# Patient Record
Sex: Female | Born: 1950 | Race: White | Hispanic: No | Marital: Married
Health system: Midwestern US, Community
[De-identification: ages and names within clinical notes are randomized; demographics above are authoritative.]

## PROBLEM LIST (undated history)

## (undated) DIAGNOSIS — M810 Age-related osteoporosis without current pathological fracture: Secondary | ICD-10-CM

## (undated) DIAGNOSIS — R011 Cardiac murmur, unspecified: Secondary | ICD-10-CM

## (undated) DIAGNOSIS — J189 Pneumonia, unspecified organism: Secondary | ICD-10-CM

## (undated) DIAGNOSIS — N159 Renal tubulo-interstitial disease, unspecified: Secondary | ICD-10-CM

## (undated) DIAGNOSIS — H532 Diplopia: Secondary | ICD-10-CM

## (undated) DIAGNOSIS — J45909 Unspecified asthma, uncomplicated: Secondary | ICD-10-CM

## (undated) DIAGNOSIS — E7800 Pure hypercholesterolemia, unspecified: Secondary | ICD-10-CM

## (undated) DIAGNOSIS — M1990 Unspecified osteoarthritis, unspecified site: Secondary | ICD-10-CM

## (undated) DIAGNOSIS — K219 Gastro-esophageal reflux disease without esophagitis: Secondary | ICD-10-CM

## (undated) DIAGNOSIS — I10 Essential (primary) hypertension: Secondary | ICD-10-CM

## (undated) DIAGNOSIS — J40 Bronchitis, not specified as acute or chronic: Secondary | ICD-10-CM

## (undated) DIAGNOSIS — M8580 Other specified disorders of bone density and structure, unspecified site: Secondary | ICD-10-CM

## (undated) HISTORY — DX: Diplopia: H53.2

## (undated) HISTORY — PX: TUBAL LIGATION: SHX77

## (undated) HISTORY — DX: Pure hypercholesterolemia, unspecified: E78.00

## (undated) HISTORY — PX: TONSILLECTOMY: SUR1361

---

## 1983-12-04 HISTORY — PX: TUBAL LIGATION: SHX77

## 1998-12-03 HISTORY — PX: NISSEN FUNDOPLICATION: SHX2091

## 2010-12-20 ENCOUNTER — Telehealth

## 2010-12-20 NOTE — Telephone Encounter (Signed)
Pt needs dexa order sent to [phone]

## 2011-01-01 NOTE — Telephone Encounter (Signed)
Scheduled for 03/2011 needs dexa order faxed to [phone]

## 2011-01-02 NOTE — Telephone Encounter (Signed)
DXA 03/2011

## 2011-01-03 NOTE — Telephone Encounter (Signed)
Dxa order faxed [phone]

## 2011-03-26 MED ORDER — RECLAST 5 MG/100ML IV SOLN
5 MG/100ML | Freq: Once | INTRAVENOUS | Status: DC
Start: 2011-03-26 — End: 2013-05-11

## 2011-03-26 NOTE — Telephone Encounter (Signed)
Patient able to be scanned (DXA).

## 2011-03-26 NOTE — Progress Notes (Signed)
PATIENT NAME: Phyllis Pineda  DATE OF BIRTH: 1951/11/04  INITIAL CONSULTATION: 08/27/2007  TODAY'S VISIT: 03/22/2010    PROBLEMS:   Low bone density by DXA 07/2000, lowest T-score -2. 1 in the spine (L1-L2, L4)    Compression fracture, incidental finding 2007, reviewed film no fracture seen    Fractured rib, age 60 (2008) slipped on ice     Family history of osteoporosis, sister and mother with multiple fractures   Natural menopause age 44 (58)  Hypertension  Environmental allergies  Severe GERD, fundoplication age 81 (43), now asymptomatic    CURRENT MANAGEMENT FOR BONE HEALTH:   Calcium: 600-900 mg/d + 600 mg/d supplements = 1300-1900 mg/d  Vitamin D: 400 IU/d along with calcium + 400 IU/d multivitamin + 5000 IU/d  Exercise: none  Pharmacologic therapy: none       PREVIOUS MEDICATIONS FOR OSTEOPOROSIS:   Fosamax 2001-2008, thought to be less effective than Actonel  Actonel 04/2007-06/2007, stopped due to muscle spasm in the back  Fosamax 70 mg weekly 2009-03/2010, stopped for a ???drug holiday???    OTHER CURRENT MEDICATIONS (SELECTED): None    OTC MEDICATIONS: None    CHIEF COMPLAINT: Here for followup visit of osteoporosis.  No new signs or symptoms    SUBJECTIVE:   She stopped alendronate as directed.  She has added a supplement of vitamin D, 5000 IU daily.  No falls, near-falls or fractures. She feels well overall.    See problem list for chronic/inactive conditions.    FOR FULL DETAILS OF FAMILY HISTORY, PAST MEDICAL AND SURGICAL HISTORY, SOCIAL HISTORY, AND REVIEW OF SYSTEMS, SEE PATIENT QUESTIONNAIRE OF TODAY'S DATE.    FAMILY HISTORY:  No family history of osteoporosis.  Otherwise, not contributory.    PAST MEDICAL HISTORY:  Not contributory except as indicated in the problem list and/or HPI.    SOCIAL HISTORY:  She does not smoke or use alcohol or caffeine to excess.  She lives with her husband.    REVIEW OF SYSTEMS:  Maximum adult height: 63.75 in.  No significant height loss.  Usual weight: 125  lbs.    PHYSICAL EXAMINATION:  NEUROLOGIC EXAM: Able to rise from chair without using arms.  No apparent focal motor or sensory deficit.  Reflexes brisk and symmetric.  Coordination appears normal.  MUSCULOSKELETAL EXAM:   Gait: Intact without difficulty.  Steady without assistance.  Spine: Spinal contours are normal.  No spine tenderness to palpation or percussion.  Ribs and pelvis: Ribs appear normal. Pelvis appears normal. Two finger spaces between ribs and pelvis.    BONE DENSITY:  Most recent done at here 03/26/2011.      T-scores  Initial study: 01/14/2009 spine L1-L4 -2.0 Lowest hip (left femoral neck) -1.0   Current study: 03/26/2011 spine L1-L4 -2.4 Lowest hip (left femoral neck) -1.4     The table below shows bone mineral density (grams/cm2), the appropriate measure for comparing serial scans An ???increase??? or ???decrease??? is significant based on precision studies done at our center according to the ISCD protocol.      PA spine Proximal Femur (left)   Date L1-L4 Femoral neck Trochanter Total hip   01/14/2009 0.830 0.743 0.601 0.841   03/22/2010 0.822 0.753 0.596 0.831   03/26/2011 0.779 0.688 0.592 0.792     IMPRESSION:  BONE DENSITY IS LOW.  BETWEEN 2011 AND 2012, BMD DECREASED IN THE SPINE AND FEMORAL NECK AND IS TRENDING DOWN IN THE TOTAL HIP.     RECORDS REVIEWED: 03/11/2007, 25-OH vitamin  D 35 ng/mL    ASSESSMENT:  Low bone density seemed stable while she was on bisphosphonate therapy 2001-2011 but decreased off treatment 2011-2012    THERAPEUTIC PLANS:  Restart treatment. She is interested in Reclast so will start that process but can go back with alendronate if needed.      We discussed the possible increased risk of myocardial infarction with over-aggressive calcium supplements; she should limit her calcium supplements to no more than 600 mg daily.      I am concerned that 5,000 IU of vitamin D daily is above the ???safe upper limit??? of 4000 IU/d recommended by the [employer] committee so  will check her 25-OH D level.    Return in 1 year with DXA.    CC:  Gennaro Africa MD  Coralee North MD

## 2011-03-27 LAB — CREATININE
Creatinine: 0.7 mg/dl (ref 0.6–1.1)
GFR Est, African/Amer: >60
GFR, Estimated: >60 (ref >60)

## 2011-03-27 LAB — VITAMIN D 25 HYDROXY: Vit D, 25-Hydroxy: 53.1 ng/ml

## 2011-03-27 LAB — CALCIUM: Calcium: 9.7 mg/dl (ref 8.3–10.6)

## 2011-03-29 ENCOUNTER — Encounter

## 2011-04-03 NOTE — Progress Notes (Signed)
Duplicate creatinine order. Results are under lab tab.

## 2011-04-13 MED ORDER — ZOLEDRONIC ACID 5 MG/100ML IV SOLN
5 MG/100ML | Freq: Once | INTRAVENOUS | Status: AC
Start: 2011-04-13 — End: 2011-04-13
  Administered 2011-04-13: 19:00:00 via INTRAVENOUS

## 2011-04-13 MED FILL — RECLAST 5 MG/100ML IV SOLN: 5 MG/100ML | INTRAVENOUS | Qty: 100

## 2011-04-13 NOTE — Progress Notes (Signed)
Reviewed instructions with patient and spouse, aware to continue fluids through the night. Given company information pamplet on Reclast.  Discharged at 1540 ambulatory after voiding.  IV dc'd at 1535.

## 2011-04-13 NOTE — Plan of Care (Signed)
Identify the learner who is being assessed for education: Patient  Ability to Learn:  Exhibits ability to grasp concepts and respond to questions: High  Ready to Learn: Yes  calm  Preferred Method of Learning:  verbal  Barriers to Learning: Verbalizes interest  Special Considerations due to cultural, religious, spiritual beliefs: no  Language: English  Language Interpreter: no    [facility] Educational Information Needs:        Pre Procedure Care   [x] Patient will verbalize understanding of pre-procedure instructions           Pain scale and pain management     [x] Patient will verbalize understanding of pain scale and pain management.     [x] Patient will verbalize plan for pain management    Fall Risk Potential, Pre-procedure  [x] No pre-procedure risk identified  [] Pre-procedure risk identified:  []  Sensory deficit  []  Motor deficit  []  Balance problem  []  Home medication  [] Uses assistive device to ambulate    Goal(s) for fall prevention:  [x]  Prevent fall or injury by use of encouraging call light for assistance, side rails, and assisting with activity.  [x] Patient / Significant other verbalize understanding of need to call for assistance prior to getting out of bed.    Infection Precautions  [x]  Patient understands implementation of infection precautions  [x]  Handwashing, skin prep prior to IV insertion    Patient Safety  [x]  Patient identification  [x]  General Safety precautions  [x]  Side rails up, bed/stretcher low position and wheels locked  [x] Call light within reach  [x]  Patient instructed to call for assistance prior to getting out of bed    Discharge instructions  [x]  Patient / family voices understanding of home care and follow up procedures.  []  Special Needs:  []  Cooling device  []  Crutches  []  Walker  []  Wound Support device   [] Drain  [] Other                    04/13/2011 3:56 PM Assunta Found

## 2011-04-13 NOTE — Discharge Instructions (Signed)
.        RECLAST PATIENT DISCHARGE INSTRUCTIONS     U.S. Brand Names Reclast; Zometa       The benefits of Reclast last for a year.   Most common side effects include flu-like symptoms, fever, muscle or joint pain, headache, nausea, vomiting, and diarrhea. Most occur within one to three days of treatment.          [facility] AMBULATORY PROCEDURE DISCHARGE INSTRUCTIONS    Please follow the instructions checked below:    DIET INSTRUCTIONS:  [x] Other : Drink plenty of noncaffeine-containing liquid unless otherwise directed  by           doctor.  Marland Kitchen             MEDICATION INSTRUCTIONS:  [x] Resume medications as listed:   [] Prescriptions sent with you.  Use as directed.  When taking pain medications, you may experience dizziness or drowsiness.  Do not drink alcohol or drive when taking these medications.  [x] You may take a non-prescription "headache remedy", if flu like symptoms  [x] Give the list of your medications to your primary care physician on your next visit. Keep your med list updated and carry it with in case of emergencies.     [x]  Take calcium and vitamin D daily, as recommended by your doctor.    FOLLOW-UP CARE:  [x] Call the office for follow-up appointment.  Watch for these significant complications.  Call physician if they or any other problems occur:    Fever over 101    Unrelieved nausea      Unrelieved pain    Flu like symptoms       Physician  DR WATTS Phone number: [phone]    The above instructions were reviewed with patient/significant other.  The following additional patient specific information was reviewed with the patient/significant other:  [] Procedure/physician specific instructions  [] Medication information sheet(s)   [] Catheter associated blood stream infections   [] Other-    I have read and understand the instructions given to me:     ____________________________________________   (Patient/S.O. Signature)           Nurse Assunta Found ,R.N.   Date/time 04/13/2011 3:25 PM          SAME DAY SERVICES:  [phone] (Monday- Friday 6 am - 5 pm)        If you smoke STOP. We care about your health!

## 2011-04-13 NOTE — Plan of Care (Signed)
Identify the learner who is being assessed for education: Patient  Ability to Learn:  Exhibits ability to grasp concepts and respond to questions: High  Ready to Learn: Yes  calm  Preferred Method of Learning:  verbal  Barriers to Learning: Verbalizes interest  Special Considerations due to cultural, religious, spiritual beliefs: no  Language: English  Language Interpreter: no    [facility] Educational Information Needs:        Pre Procedure Care   [x] Patient will verbalize understanding of pre-procedure instructions           Pain scale and pain management     [x] Patient will verbalize understanding of pain scale and pain management.     [x] Patient will verbalize plan for pain management    Fall Risk Potential, Pre-procedure  [x] No pre-procedure risk identified  [] Pre-procedure risk identified:  []  Sensory deficit  []  Motor deficit  []  Balance problem  []  Home medication  [] Uses assistive device to ambulate    Goal(s) for fall prevention:  [x]  Prevent fall or injury by use of encouraging call light for assistance, side rails, and assisting with activity.  [x] Patient / Significant other verbalize understanding of need to call for assistance prior to getting out of bed.    Infection Precautions  [x]  Patient understands implementation of infection precautions  [x]  Handwashing, skin prep prior to IV insertion    Patient Safety  [x]  Patient identification  [x]  General Safety precautions  [x]  Side rails up, bed/stretcher low position and wheels locked  [x] Call light within reach  [x]  Patient instructed to call for assistance prior to getting out of bed    Discharge instructions  [x]  Patient / family voices understanding of home care and follow up procedures.  []  Special Needs:  []  Cooling device  []  Crutches  []  Walker  []  Wound Support device   [] Drain  [] Other                    04/13/2011 4:13 PM Roque Cash

## 2011-05-21 NOTE — Telephone Encounter (Signed)
Patient is aware. Thank you.

## 2011-05-21 NOTE — Telephone Encounter (Signed)
For 25-hydroxyvitamin D, anything between 30 to 60 is good, so not need to change from her current dose.  Thanks.

## 2011-05-21 NOTE — Telephone Encounter (Signed)
Vitamin d level 53.1; patient recalled MD stating that her daily dosage may be changed from her lab results. Would you like to change her vitamin D from 5000 IC/daily to something else?

## 2012-04-17 ENCOUNTER — Encounter

## 2012-04-21 MED ORDER — RECLAST 5 MG/100ML IV SOLN
5 MG/100ML | Freq: Once | INTRAVENOUS | Status: DC
Start: 2012-04-21 — End: 2013-05-11

## 2012-04-21 NOTE — Progress Notes (Signed)
[facility]  [address]  [place] [address]  Phone [phone]   Fax [phone]    NAME: Phyllis Pineda, Phyllis Pineda   DOB: [birth year]   STUDY DATE: 04/21/2012     REFERRING PROVIDER: Lynnell Dike MD    OTHERS WHO NEED REPORTS: Drs. Shelly Stanforth, Avis Waldo Laine and [employer]    INDICATION(S) FOR PERFORMING THE STUDY:  osteopenia (733.90)    CLINICAL INFORMATION PROVIDED BY THE PATIENT: 61 year old woman.  She started natural menopause at age 48. She has not taken estrogen. No history of fragility fractures.  No long-term corticosteroid use. She took Fosamax 2001-03/2010 (stopped for a ???drug holiday???)  She is currently being treated with Reclast (started 04/2011).  Family history of osteoporosis (mother, 2 sisters).    EQUIPMENT: Hologic Discovery       POSITIONING: Good   REGIONS OF INTEREST: Correct   ARTIFACTS: None   STUDY VALID? Yes    T-scores  Initial study: 01/14/2009 spine L1-L4 -2.0 Lowest hip (left fem. neck) -1.0   Current study: 04/21/2012 spine L1-L4 -2.1 Lowest hip (left fem. neck) -1.5     The table below shows bone mineral density (grams/cm2), the appropriate measure for comparing serial scans An ???increase??? or ???decrease??? is significant based on precision studies done at our center according to the ISCD protocol.      PA spine Proximal Femur (left)   Date L1-L4 fem. neck Trochanter Total hip   01/14/2009 0.830 0.743 0.601 0.841   03/22/2010 0.822 0.753 0.596 0.831   03/26/2011 0.779 0.688 0.592 0.792   04/21/2012 0.818 0.685 0.614 0.811     IMPRESSION:  BONE DENSITY IS LOW.  BETWEEN 2012 AND 2013, BMD INCREASED IN THE SPINE AND IS TRENDING UP IN THE TROCHANTER AND TOTAL HIP.     Consider repeating this study in 1-2 years to assess the patient's progress.    _________________________________________________   Scherrie Bateman Jahan Friedlander MD, Director, [facility]

## 2012-04-21 NOTE — Progress Notes (Signed)
PATIENT NAME: Phyllis Pineda  DATE OF BIRTH: 20-Jul-1951  INITIAL CONSULTATION: 08/27/2007  MOST RECENT VISIT: 03/26/2011  TODAY'S DATE: 04/21/2012    PROBLEMS:   Low bone density by DXA 07/2000, lowest T-score -2. 1 in the spine (L1-L2, L4)    Compression fracture, incidental finding 2007, reviewed film no fracture seen    Fractured rib, age 61 (2008) slipped on ice     Family history of osteoporosis, sister and mother with multiple fractures   Natural menopause age 69 (73)  Hypertension  Environmental allergies  Severe GERD, fundoplication age 61 (11)    CURRENT MANAGEMENT FOR BONE HEALTH:   Calcium: 600-900 mg/d + 600 mg/d supplements = 1300-1900 mg/d  Vitamin D: 400 IU/d along with calcium + 400 IU/d multivitamin = 800 IU/d    25-OH D 53 ng/mL 03/2011  Exercise: none  Pharmacologic therapy: Reclast started 04/2011     PREVIOUS MEDICATIONS FOR OSTEOPOROSIS:   Fosamax 2001-2008, thought to be less effective than Actonel  Actonel 04/2007-06/2007, stopped due to muscle spasm in the back  Fosamax 70 mg weekly 2009-03/2010, stopped for a ???drug holiday???    OTHER CURRENT MEDICATIONS (SELECTED): None    OTC MEDICATIONS: None    CHIEF COMPLAINT: Here for followup visit of osteoporosis.  No new signs or symptoms    SUBJECTIVE:   She received Reclast without side effects.  No falls, near-falls or fractures. She feels well overall. She has had some intermittent heartburn - nothing severe - and is working with Dr. Lady Gary on this.    See problem list for chronic/inactive conditions.    FOR FULL DETAILS OF FAMILY HISTORY, PAST MEDICAL AND SURGICAL HISTORY, SOCIAL HISTORY, AND REVIEW OF SYSTEMS, SEE PATIENT QUESTIONNAIRE OF TODAY'S DATE.    FAMILY HISTORY:  No family history of osteoporosis.  Otherwise, not contributory.    PAST MEDICAL HISTORY:  Not contributory except as indicated in the problem list and/or HPI.    SOCIAL HISTORY:  She does not smoke or use alcohol or caffeine to excess.  She lives with her  husband.    REVIEW OF SYSTEMS:  Maximum adult height: 63.75 in.  No significant height loss.  Usual weight: 125 lbs.    PHYSICAL EXAMINATION:  NEUROLOGIC EXAM: Able to rise from chair without using arms.  No apparent focal motor or sensory deficit.  Reflexes brisk and symmetric.  Coordination appears normal.  MUSCULOSKELETAL EXAM:   Gait: Intact without difficulty.  Steady without assistance.  Spine: Spinal contours are normal.  No spine tenderness to palpation or percussion.  Ribs and pelvis: Ribs appear normal. Pelvis appears normal. Two finger spaces between ribs and pelvis.    BONE DENSITY:  Most recent done at here 04/21/2012.      T-scores  Initial study: 01/14/2009 spine L1-L4 -2.0 Lowest hip (left fem. neck) -1.0   Current study: 04/21/2012 spine L1-L4 -2.1 Lowest hip (left fem. neck) -1.5     The table below shows bone mineral density (grams/cm2), the appropriate measure for comparing serial scans An ???increase??? or ???decrease??? is significant based on precision studies done at our center according to the ISCD protocol.      PA spine Proximal Femur (left)   Date L1-L4 fem. neck Trochanter Total hip   01/14/2009 0.830 0.743 0.601 0.841   03/22/2010 0.822 0.753 0.596 0.831   03/26/2011 0.779 0.688 0.592 0.792   04/21/2012 0.818 0.685 0.614 0.811     IMPRESSION:  BONE DENSITY IS LOW.  BETWEEN 2012 AND 2013,  BMD INCREASED IN THE SPINE AND IS TRENDING UP IN THE TROCHANTER AND TOTAL HIP.     ASSESSMENT:  Low bone density seemed stable while she was on bisphosphonate therapy 2001-2011 but decreased off treatment 2011-2012.  She is doing fine with Reclast started 04/2011.    PLANS:  Continue Reclast yearly, labs before (calcium, creatinine).  Return in 1 year with DXA.    CC:  Gennaro Africa MD  Babs Bertin MD  Helane Gunther MD

## 2012-04-22 NOTE — Telephone Encounter (Signed)
Patient received her flu shot in 09/11/2011

## 2012-04-22 NOTE — Telephone Encounter (Signed)
Left message for the physician Mariana Single(Shelly Stanforth) office related to lab results.

## 2012-04-22 NOTE — Telephone Encounter (Signed)
Message copied by Ross LudwigSWEET, CORAY E. on Tue Apr 22, 2012  8:59 AM  ------       Message from: WestlakeMYCHART, PROCESSOR       Created: Mon Apr 21, 2012  3:50 PM       Regarding: Non-Urgent Medical Question       Contact: [phone]         Dr. Grace IsaacWatts, For your records, Flu Shot recevied September 11, 2011 Regards, Nelda MarseillePat Schreier  ------

## 2012-05-01 NOTE — Telephone Encounter (Signed)
Spoke with BentleyJasmine at [employer], no prior authorization is required due to patient has an [employer] plan. Ref. #[id number].

## 2012-05-01 NOTE — Telephone Encounter (Signed)
Left message for the patient to call the office related to the reclast benefit summary.

## 2012-05-02 NOTE — Telephone Encounter (Signed)
Read verbatim reclast benefit summary. Patient acknowledged. I let the patient know that the [facility] and they will call her to set up an appointment

## 2012-05-19 MED ORDER — ZOLEDRONIC ACID 5 MG/100ML IV SOLN
5 MG/100ML | Freq: Once | INTRAVENOUS | Status: AC
Start: 2012-05-19 — End: 2012-05-19
  Administered 2012-05-19: 19:00:00 via INTRAVENOUS

## 2012-05-19 MED FILL — ZOLEDRONIC ACID 5 MG/100ML IV SOLN: 5 MG/100ML | INTRAVENOUS | Qty: 100

## 2012-05-19 NOTE — Discharge Instructions (Signed)
.        RECLAST PATIENT DISCHARGE INSTRUCTIONS     U.S. Brand Names Reclast; Zometa       The benefits of Reclast last for a year.   Most common side effects include flu-like symptoms, fever, muscle or joint pain, headache, nausea, vomiting, and diarrhea. Most occur within one to three days of treatment.          [facility] AMBULATORY PROCEDURE DISCHARGE INSTRUCTIONS    Please follow the instructions checked below:    DIET INSTRUCTIONS:  [x] Other : Drink plenty of noncaffeine-containing liquid unless otherwise directed by      doctor.  Marland Kitchen             MEDICATION INSTRUCTIONS:  [x] Resume medications as listed:   [] Prescriptions sent with you.  Use as directed.  When taking pain medications, you may experience dizziness or drowsiness.  Do not drink alcohol or drive when taking these medications.  [x] You may take a non-prescription "headache remedy", if flu like symptoms  [x] Give the list of your medications to your primary care physician on your next visit. Keep your med list updated and carry it with in case of emergencies.     [x]  Take calcium and vitamin D daily, as recommended by your doctor.    FOLLOW-UP CARE:  [x] Call the office for follow-up appointment.  Watch for these significant complications.  Call physician if they or any other problems occur:    Fever over 101    Unrelieved nausea      Unrelieved pain    Flu like symptoms       Physician DR WATTS Phone number: [phone]      The above instructions were reviewed with patient/significant other.  The following additional patient specific information was reviewed with the patient/significant other:  [] Procedure/physician specific instructions  [] Medication information sheet(s)   [] Catheter associated blood stream infections   [] Other-    I have read and understand the instructions given to me:     ____________________________________________   (Patient/S.O. Signature)           Date/time 05/19/2012 3:07 PM         SAME DAY SERVICES:  [phone]  (Monday- Friday 6 am - 5 pm)        If you smoke STOP. We care about your health!

## 2012-05-19 NOTE — Progress Notes (Addendum)
Procedure:  Reclast Infusion  May 19, 2012  Elaina Pattee    Allergies:    Allergies   Allergen Reactions   . Ciprofloxacin    . Diovan (Valsartan)    . Lotrel    . Tetracyclines        Filed Vitals:    05/19/12 1522   BP: 131/75   Pulse: 71   Temp:    Resp: 18       Creatinine Clearance: 96.2mg /dl      Mental Status:  Normal  Readiness to learn:  Yes  Barriers to learning: No    Pain Assessment   Pain Present:  no  Pain Score:  0  Pain Quality/Description:      Mobility/Safety/Self-Care:  Steady Gait:  Yes  Assistive Device:  None   Poor Hygiene:  No    Procedure Verification with:  [x]  RN  []  Patient  []  Other:     Note Reviewed DC reclast instructions.  Very cooperative with taking fluids and aware to continue fluids. No reaction to meds and released ambulatory. Pressure to IV site no bleeding  Reclast  Infusion begun 1442  Infusion ended 1514    Plan of Care Goals:  Safety measures met:  Yes  Patient understands explanation of procedure:  Yes  Patient understands post procedure instructions and to resume regularly scheduled home meds as directed:  Yes  1540 Late entry Note to dr Grace Isaac was faxed to inform him she received her reclast today no problems.  Time in:  1420  Time out:  1520

## 2013-05-08 ENCOUNTER — Encounter

## 2013-05-11 MED ORDER — RECLAST 5 MG/100ML IV SOLN
5 MG/100ML | Freq: Once | INTRAVENOUS | Status: AC
Start: 2013-05-11 — End: 2013-05-11

## 2013-05-11 NOTE — Progress Notes (Signed)
PATIENT NAME: Jenille Odriscoll  DATE OF BIRTH: 04-07-1951  INITIAL CONSULTATION: 08/27/2007  MOST RECENT VISIT: 04/21/2012  TODAY'S DATE: 05/11/2013    PROBLEMS:   Low bone density by DXA 07/2000, lowest T-score -2. 1 in the spine (L1-L2, L4)    Compression fracture, incidental finding 2007, reviewed film no fracture seen    Fractured rib, age 62 (2008) slipped on ice     Family history of osteoporosis, sister and mother with multiple fractures   Natural menopause age 41 (68)  Hypertension  Environmental allergies  Severe GERD, fundoplication age 57 (26), now asymptomatic    CURRENT MANAGEMENT FOR BONE HEALTH:   Calcium: 600-900 mg/d + 500 mg/d supplements = 1100-1400 mg/d  Vitamin D: 400 IU with calcium + 500 IU multivitamin+ 5000 IU= 5900 IU/d    25-OH D 53 ng/mL 03/2011  Exercise: walking, gardening  Pharmacologic therapy: Reclast started 04/2011     PREVIOUS MEDICATIONS FOR OSTEOPOROSIS:   Fosamax 2001-2008, thought to be less effective than Actonel  Actonel 04/2007-06/2007, stopped due to muscle spasm in the back  Fosamax 70 mg weekly 2009-03/2010, stopped for a ???drug holiday???    OTHER CURRENT MEDICATIONS (SELECTED): None    OTC MEDICATIONS: None    CHIEF COMPLAINT: Here for followup visit of osteoporosis.  No new signs or symptoms    SUBJECTIVE:   She received Reclast without side effects.  No falls, near-falls or fractures. She feels well overall.     See problem list for chronic/inactive conditions.    FOR FULL DETAILS OF FAMILY HISTORY, PAST MEDICAL AND SURGICAL HISTORY, SOCIAL HISTORY, AND REVIEW OF SYSTEMS, SEE PATIENT QUESTIONNAIRE OF TODAY'S DATE.    PHYSICAL EXAMINATION:  NEUROLOGIC EXAM: Able to rise from chair without using arms.  No apparent focal motor or sensory deficit.  Reflexes brisk and symmetric.  Coordination appears normal.  MUSCULOSKELETAL EXAM:   Gait: Intact without difficulty.  Steady without assistance.  Spine: Spinal contours are normal.  No spine tenderness to palpation or  percussion.  Ribs and pelvis: Ribs appear normal. Pelvis appears normal. Two finger spaces between ribs and pelvis.      BONE DENSITY:  Most recent done at here 05/11/2013.    T-scores  Initial study: 01/14/2009 spine L1-L4 -2.0 Lowest hip (left fem. neck) -1.0   Current study: 05/11/2013 spine L1-L2-L4 -2.4 Lowest hip (left fem. neck) -1.6     The table below shows bone mineral density (grams/cm2), the appropriate measure for comparing serial scans An ???increase??? or ???decrease??? is significant based on precision studies done at our center according to the ISCD protocol.      PA spine Proximal Femur (left)   Date L1-L2-L4 fem. neck Trochanter Total hip   01/14/2009 NA 0.743 0.601 0.841   03/22/2010 NA 0.753 0.596 0.831   03/26/2011 0.768 0.688 0.592 0.792   04/21/2012 0.800 0.685 0.614 0.811   05/11/2013 0.768 0.666 0.587 0.791     IMPRESSION:  BONE DENSITY IS LOW.  BETWEEN 2013 AND 2014, BMD DID NOT CHANGE SIGNIFICANTLY IN THE SPINE OR LEFT HIP.     Labs: 03/26/2011, Ca 9.7, Cr 0.7  04/06/2013, Ca 9.7, Cr 0.66  Reviewed DXA printouts    ASSESSMENT:  Low bone density seemed stable while she was on bisphosphonate therapy 2001-2011 but decreased off treatment 2011-2012.  She is doing fine with Reclast started 04/2011.  Current vitamin D intake may be too high.    PLANS:  Check 25-OH D. Continue Reclast for one more dose.  Return in 1 year with DXA. Consider a ???drug holiday??? at that time.    Scherrie Bateman Eli Pattillo MD, Director, [facility]    CC:  Gennaro Africa MD  Babs Bertin MD  Helane Gunther MD  Copy also for the patient

## 2013-05-12 LAB — VITAMIN D 25 HYDROXY: Vit D, 25-Hydroxy: 67 ng/mL (ref >29)

## 2013-05-12 NOTE — Progress Notes (Signed)
[facility]  [address]  [place] [address]  Phone [phone]   Fax [phone]    NAME: Phyllis Pineda, Phyllis Pineda   DOB: 1951-12-02   STUDY DATE: 05/11/2013     REFERRING PROVIDER: Lynnell Dike MD    OTHERS WHO NEED REPORTS: Drs. Shelly Stanforth, Avis Waldo Laine and [employer]    INDICATION(S) FOR PERFORMING THE STUDY:  osteopenia (733.90)    CLINICAL INFORMATION PROVIDED BY THE PATIENT: 62 year old woman.  She started natural menopause at age 72. She has not taken estrogen. No history of fragility fractures.  No long-term corticosteroid use. She took Fosamax 2001-03/2010 (stopped for a ???drug holiday???)  She is currently being treated with Reclast (started 04/2011).  Family history of osteoporosis (mother, 2 sisters).    EQUIPMENT: H[employer]   POSITIONING: Good   REGIONS OF INTEREST: Correct   ARTIFACTS: None   STUDY VALID? Yes; L3 was deleted because of T-score discrepancy starting 2014.    T-scores  Initial study: 01/14/2009 spine L1-L4 -2.0 Lowest hip (left fem. neck) -1.0   Current study: 05/11/2013 spine L1-L2-L4 -2.4 Lowest hip (left fem. neck) -1.6     The table below shows bone mineral density (grams/cm2), the appropriate measure for comparing serial scans An ???increase??? or ???decrease??? is significant based on precision studies done at our center according to the ISCD protocol.      PA spine Proximal Femur (left)   Date L1-L2-L4 fem. neck Trochanter Total hip   01/14/2009 NA 0.743 0.601 0.841   03/22/2010 NA 0.753 0.596 0.831   03/26/2011 0.768 0.688 0.592 0.792   04/21/2012 0.800 0.685 0.614 0.811   05/11/2013 0.768 0.666 0.587 0.791     IMPRESSION:  BONE DENSITY IS LOW.  BETWEEN 2013 AND 2014, BMD DID NOT CHANGE SIGNIFICANTLY IN THE SPINE OR LEFT HIP.     Consider repeating this study in 1-2 years to assess the patient's progress.    _________________________________________________   Scherrie Bateman Maevis Mumby MD, Director, [facility]

## 2013-05-15 LAB — CREATINE
Creatinine: 0.66 mg/dL (ref 0.44–1.03)
GFR African American: >60 mL/min/{1.73_m2} (ref >60)
GFR Non-African American: >60 mL/min/{1.73_m2} (ref >60)

## 2013-05-15 LAB — CALCIUM: Calcium: 9.6 mg/dL (ref 8.5–10.5)

## 2013-06-08 MED ORDER — ZOLEDRONIC ACID 5 MG/100ML IV SOLN
5 MG/100ML | Freq: Once | INTRAVENOUS | Status: AC
Start: 2013-06-08 — End: 2013-06-08
  Administered 2013-06-08: 20:00:00 via INTRAVENOUS

## 2013-06-08 MED FILL — ZOLEDRONIC ACID 5 MG/100ML IV SOLN: 5 MG/100ML | INTRAVENOUS | Qty: 100

## 2013-06-08 NOTE — Progress Notes (Signed)
Pt tolerated reclast infusion well, drank 2 apple juices during infusion. No probvlems noted, IV d/c'd, pt up to void then D/C'd to home.

## 2013-06-08 NOTE — Discharge Instructions (Signed)
.        RECLAST PATIENT DISCHARGE INSTRUCTIONS     U.S. Brand Names Reclast; Zometa       The benefits of Reclast last for a year.   Most common side effects include flu-like symptoms, fever, muscle or joint pain, headache, nausea, vomiting, and diarrhea. Most occur within one to three days of treatment.          [facility] AMBULATORY PROCEDURE DISCHARGE INSTRUCTIONS    Please follow the instructions checked below:    DIET INSTRUCTIONS:  [x] Other : Drink plenty of noncaffeine-containing liquid unless otherwise directed by      doctor.  Marland Kitchen             MEDICATION INSTRUCTIONS:  [x] Resume medications as listed:   [] Prescriptions sent with you.  Use as directed.  When taking pain medications, you may experience dizziness or drowsiness.  Do not drink alcohol or drive when taking these medications.  [x] You may take a non-prescription "headache remedy", if flu like symptoms  [x] Give the list of your medications to your primary care physician on your next visit. Keep your med list updated and carry it with in case of emergencies.     [x]  Take calcium and vitamin D daily, as recommended by your doctor.    FOLLOW-UP CARE:  [x] Call the office for follow-up appointment when needed.  Watch for these significant complications.  Call physician if they or any other problems occur:    Fever over 101    Unrelieved nausea      Unrelieved pain    Flu like symptoms       Physician Dr Grace Isaac       The above instructions were reviewed with patient/significant other.  The following additional patient specific information was reviewed with the patient/significant other:  [] Procedure/physician specific instructions  [] Medication information sheet(s)   [] Catheter associated blood stream infections   [] Other-    I have read and understand the instructions given to me:     ____________________________________________   (Patient/S.O. Signature)           Date/time 06/08/2013 3:44 PM         SAME DAY SERVICES:  [phone] (Monday-  Friday 6 am - 5 pm)        If you smoke STOP. We care about your health!

## 2014-05-04 ENCOUNTER — Encounter

## 2014-05-24 NOTE — Progress Notes (Signed)
[facility]  [address]  [place] [id number]  Phone [phone]   Fax [phone]    NAME: [name], Phyllis C.   DOB: September 02, 1951   STUDY DATE: 05/24/2014     REFERRING PROVIDER: Lynnell DikeNelson Josefita Weissmann MD     INDICATION(S) FOR PERFORMING THE STUDY:  osteopenia (733.90)    CLINICAL INFORMATION PROVIDED BY THE PATIENT: 63 year old woman.  She started natural menopause at age 63. She has not taken estrogen. No history of fragility fractures.  No long-term corticosteroid use. She took Fosamax 2001-03/2010 (stopped for a ???drug holiday???)  She is currently being treated with Reclast (started 04/2011).  Family history of osteoporosis (mother, 2 sisters).    EQUIPMENT: Hologic Discovery   POSITIONING: Good   REGIONS OF INTEREST: Correct   ARTIFACTS: None   STUDY VALID? Yes; L3 was deleted starting 2014 because of T-score discrepancy.    T-scores  Initial study: 01/14/2009 spine L1-L4 -2.0 Lowest hip (left fem. neck) -1.0   Current study: 05/24/2014 spine L1-L2-L4 -2.3 Lowest hip (left fem. neck) -1.6     The table below shows bone mineral density (grams/cm2), the appropriate measure for comparing serial scans An ???increase??? or ???decrease??? is significant based on precision studies done at our center according to the ISCD protocol.      PA spine Proximal Femur (left)   Date L1-L2-L4 fem. neck Trochanter Total hip   01/14/2009 NA 0.743 0.601 0.841   03/22/2010 NA 0.753 0.596 0.831   03/26/2011 0.768 0.688 0.592 0.792   04/21/2012 0.800 0.685 0.614 0.811   05/11/2013 0.768 0.666 0.587 0.791   05/24/2014 0.785 0.670 0.608 0.794     IMPRESSION:  BONE DENSITY IS LOW.  BETWEEN 2014 AND 2015, BMD DID NOT CHANGE SIGNIFICANTLY IN THE SPINE OR LEFT HIP.     Consider repeating this study in 1-2 years to assess the patient's progress.    _________________________________________________   Scherrie BatemanNelson B. Shaneen Reeser MD, Director, [facility]

## 2014-05-24 NOTE — Progress Notes (Signed)
PATIENT NAME: Phyllis Pineda  DATE OF BIRTH: 1951-06-09  INITIAL CONSULTATION: 08/27/2007  MOST RECENT VISIT: 05/11/2013  TODAY'S DATE: 05/24/2014    PROBLEMS:   Low bone density by DXA 07/2000, lowest T-score -2. 1 in the spine (L1-L2, L4)    Compression fracture, incidental finding 2007, reviewed film no fracture seen    Fractured rib, age 63 (2008) slipped on ice     Family history of osteoporosis, sister and mother with hip and multiple other fractures   Natural menopause age [age](1999)  Hypertension  Environmental allergies  Severe GERD, fundoplication age [age](2000), now asymptomatic    CURRENT MANAGEMENT FOR BONE HEALTH:   Calcium: 600-900 mg/d + 500 mg/d supplements = 1100-1400 mg/d  Vitamin D: 400 IU with calcium + 500 IU multivitamin + 2000 IU= 2900 IU/d    25-OH D 67 ng/mL 05/2013 with vitamin D 5900 IU/d  Exercise: walking, gardening  Pharmacologic therapy: Reclast 04/2011, 2013, 06/2013     PREVIOUS MEDICATIONS FOR OSTEOPOROSIS:   Fosamax 2001-2008, thought to be less effective than Actonel  Actonel 04/2007-06/2007, stopped due to muscle spasm in the back  Fosamax 70 mg weekly 2009-03/2010, stopped for a ???drug holiday???    OTHER CURRENT MEDICATIONS (SELECTED): None  OTC MEDICATIONS: None    CHIEF COMPLAINT: Here for followup visit of osteoporosis.  No new signs or symptoms    SUBJECTIVE:   See problem list for chronic/inactive conditions   She received Reclast without side effects.  No falls, near-falls or fractures. She feels well overall. Since last year her mother had a hip fracture and then died. Ms. Homero FellersFrank has retired and is moving to [place] to be near her grandchildren.  25-OH vitamin D was high last year and she reduced her dose from 5000 IU/d to 2000 IU/d.    FOR FULL DETAILS OF FAMILY HISTORY, PAST MEDICAL AND SURGICAL HISTORY, SOCIAL HISTORY, AND REVIEW OF SYSTEMS, SEE PATIENT QUESTIONNAIRE OF TODAY'S DATE.    PHYSICAL EXAMINATION:  NEUROLOGIC EXAM: Able to rise from chair without using  arms.  No apparent focal motor or sensory deficit.  Reflexes brisk and symmetric.  Coordination appears normal.  MUSCULOSKELETAL EXAM:   Gait: Intact without difficulty.  Steady without assistance.  Spine: Spinal contours are normal.  No spine tenderness to palpation or percussion.  Ribs and pelvis: Ribs appear normal. Pelvis appears normal. Two finger spaces between ribs and pelvis.      BONE DENSITY:  Most recent done at here using Hologic equipment.    T-scores  Initial study: 01/14/2009 spine L1-L4 -2.0 Lowest hip (left fem. neck) -1.0   Current study: 05/24/2014 spine L1-L2-L4 -2.3 Lowest hip (left fem. neck) -1.6     The table below shows bone mineral density (grams/cm2), the appropriate measure for comparing serial scans An ???increase??? or ???decrease??? is significant based on precision studies done at our center according to the ISCD protocol.      PA spine Proximal Femur (left)   Date L1-L2-L4 fem. neck Trochanter Total hip   01/14/2009 NA 0.743 0.601 0.841   03/22/2010 NA 0.753 0.596 0.831   03/26/2011 0.768 0.688 0.592 0.792   04/21/2012 0.800 0.685 0.614 0.811   05/11/2013 0.768 0.666 0.587 0.791   05/24/2014 0.785 0.670 0.608 0.794     IMPRESSION:  BONE DENSITY IS LOW.  BETWEEN 2014 AND 2015, BMD DID NOT CHANGE SIGNIFICANTLY IN THE SPINE OR LEFT HIP.     Labs: 03/26/2011, Ca 9.7, Cr 0.7. 04/06/2013, Ca 9.7,  Cr 0.66.  05/2013, Ca 9.6, Cr 0.66  IMAGING. Reviewed DXA printouts    ASSESSMENT:  Low bone density seemed stable while she was on bisphosphonate therapy 2001-2011 but decreased off treatment 2011-2012.  She is doing fine with Reclast started 04/2011.      PLANS:   Hold Reclast a ???drug holiday??? of at least a year or two. For followup I gave her the names of Drs. Will Truslow in [place] and [place] in [place].    Scherrie BatemanNelson B. Thi Klich MD, Director, [facility]

## 2014-05-24 NOTE — Patient Instructions (Signed)
Dr. Daisy [place]Andrew [place]aster, [facility] [place]    Dr. Vicki MalletWill Truslow in [place], [phone]

## 2014-12-03 HISTORY — PX: BREAST BIOPSY: SHX20

## 2014-12-14 ENCOUNTER — Other Ambulatory Visit: Payer: Self-pay | Admitting: Family Medicine

## 2014-12-14 ENCOUNTER — Ambulatory Visit
Admission: RE | Admit: 2014-12-14 | Discharge: 2014-12-14 | Disposition: A | Discharge status: Home / Self Care | Payer: 59 | Source: Ambulatory Visit | Attending: Family Medicine | Admitting: Family Medicine

## 2014-12-14 DIAGNOSIS — M549 Dorsalgia, unspecified: Secondary | ICD-10-CM

## 2014-12-15 ENCOUNTER — Other Ambulatory Visit: Payer: Self-pay | Admitting: Obstetrics and Gynecology

## 2014-12-15 DIAGNOSIS — N6310 Unspecified lump in the right breast, unspecified quadrant: Secondary | ICD-10-CM

## 2014-12-27 ENCOUNTER — Other Ambulatory Visit: Payer: 59

## 2015-01-03 ENCOUNTER — Ambulatory Visit
Admission: RE | Admit: 2015-01-03 | Discharge: 2015-01-03 | Disposition: A | Discharge status: Home / Self Care | Payer: 59 | Source: Ambulatory Visit | Attending: Obstetrics and Gynecology | Admitting: Obstetrics and Gynecology

## 2015-01-03 DIAGNOSIS — N6310 Unspecified lump in the right breast, unspecified quadrant: Secondary | ICD-10-CM

## 2015-01-21 ENCOUNTER — Other Ambulatory Visit: Payer: Self-pay | Admitting: Obstetrics and Gynecology

## 2015-01-21 DIAGNOSIS — R928 Other abnormal and inconclusive findings on diagnostic imaging of breast: Secondary | ICD-10-CM

## 2015-01-27 ENCOUNTER — Ambulatory Visit
Admission: RE | Admit: 2015-01-27 | Discharge: 2015-01-27 | Disposition: A | Discharge status: Home / Self Care | Payer: 59 | Source: Ambulatory Visit | Attending: Obstetrics and Gynecology | Admitting: Obstetrics and Gynecology

## 2015-01-27 ENCOUNTER — Other Ambulatory Visit: Payer: Self-pay | Admitting: Obstetrics and Gynecology

## 2015-01-27 DIAGNOSIS — R928 Other abnormal and inconclusive findings on diagnostic imaging of breast: Secondary | ICD-10-CM

## 2015-02-02 ENCOUNTER — Other Ambulatory Visit: Payer: Self-pay | Admitting: Obstetrics and Gynecology

## 2015-02-02 DIAGNOSIS — R928 Other abnormal and inconclusive findings on diagnostic imaging of breast: Secondary | ICD-10-CM

## 2015-02-03 ENCOUNTER — Ambulatory Visit
Admission: RE | Admit: 2015-02-03 | Discharge: 2015-02-03 | Disposition: A | Discharge status: Home / Self Care | Payer: 59 | Source: Ambulatory Visit | Attending: Obstetrics and Gynecology | Admitting: Obstetrics and Gynecology

## 2015-02-03 DIAGNOSIS — R928 Other abnormal and inconclusive findings on diagnostic imaging of breast: Secondary | ICD-10-CM

## 2015-04-13 NOTE — Telephone Encounter (Signed)
Patient stated that she needed her medical records sent to another doctor.  She contacted medical records and they said she needed to call our office because they did not have any of her records.  They told her Dr. Grace IsaacWatts keeps his own records.  She wants them sent to:  Dr. Daisy BlossomAndrew Laster  [phone] fax    Please advise.  She can be reached at [phone].

## 2015-04-13 NOTE — Telephone Encounter (Signed)
Fax[facility] last two [facility] [phone]. I let the patient know that office faxed over last two office visit notes. If Dr. Jacquenette ShoneLaster needs to see the Dexa pictures, that can be obtained from where ever the scan was done. Patient acknowledged

## 2015-04-19 ENCOUNTER — Other Ambulatory Visit: Payer: Self-pay

## 2015-04-19 DIAGNOSIS — Z1231 Encounter for screening mammogram for malignant neoplasm of breast: Secondary | ICD-10-CM

## 2015-04-27 ENCOUNTER — Ambulatory Visit
Admission: RE | Admit: 2015-04-27 | Discharge: 2015-04-27 | Disposition: A | Discharge status: Home / Self Care | Payer: 59 | Source: Ambulatory Visit

## 2015-04-27 DIAGNOSIS — Z1231 Encounter for screening mammogram for malignant neoplasm of breast: Secondary | ICD-10-CM

## 2015-07-28 ENCOUNTER — Other Ambulatory Visit (HOSPITAL_COMMUNITY): Payer: Self-pay | Admitting: *Deleted

## 2015-07-29 ENCOUNTER — Encounter (HOSPITAL_COMMUNITY)
Admission: RE | Admit: 2015-07-29 | Discharge: 2015-07-29 | Disposition: A | Discharge status: Home / Self Care | Payer: 59 | Source: Ambulatory Visit | Attending: Family Medicine | Admitting: Family Medicine

## 2015-07-29 DIAGNOSIS — M810 Age-related osteoporosis without current pathological fracture: Secondary | ICD-10-CM | POA: Diagnosis present

## 2015-07-29 MED ORDER — ZOLEDRONIC ACID 5 MG/100ML IV SOLN
INTRAVENOUS | Status: AC
  Administered 2015-07-29: 5 mg via INTRAVENOUS
  Filled 2015-07-29: qty 100

## 2015-07-29 MED ORDER — ZOLEDRONIC ACID 5 MG/100ML IV SOLN
5.0000 mg | Freq: Once | INTRAVENOUS | Status: AC
  Administered 2015-07-29: 5 mg via INTRAVENOUS

## 2015-12-14 ENCOUNTER — Other Ambulatory Visit (HOSPITAL_COMMUNITY)
Admission: RE | Admit: 2015-12-14 | Discharge: 2015-12-14 | Disposition: A | Discharge status: Home / Self Care | Payer: 59 | Source: Ambulatory Visit | Attending: Family Medicine | Admitting: Family Medicine

## 2015-12-14 ENCOUNTER — Other Ambulatory Visit: Payer: Self-pay | Admitting: Family Medicine

## 2015-12-14 DIAGNOSIS — Z01411 Encounter for gynecological examination (general) (routine) with abnormal findings: Secondary | ICD-10-CM | POA: Diagnosis present

## 2015-12-14 DIAGNOSIS — Z1151 Encounter for screening for human papillomavirus (HPV): Secondary | ICD-10-CM | POA: Diagnosis present

## 2015-12-16 LAB — CYTOLOGY - PAP

## 2016-04-27 ENCOUNTER — Other Ambulatory Visit: Payer: Self-pay

## 2016-04-27 DIAGNOSIS — Z1231 Encounter for screening mammogram for malignant neoplasm of breast: Secondary | ICD-10-CM

## 2016-05-14 ENCOUNTER — Ambulatory Visit
Admission: RE | Admit: 2016-05-14 | Discharge: 2016-05-14 | Disposition: A | Discharge status: Home / Self Care | Payer: 59 | Source: Ambulatory Visit

## 2016-05-14 ENCOUNTER — Other Ambulatory Visit: Payer: Self-pay | Admitting: Family Medicine

## 2016-05-14 DIAGNOSIS — Z1231 Encounter for screening mammogram for malignant neoplasm of breast: Secondary | ICD-10-CM

## 2016-07-07 IMAGING — CR DG THORACIC SPINE 3V
3 series · 3 of 3 positions shown · non-contrast
Comparison: None.

CLINICAL DATA: Mid thoracic pain for 1 year.  No known injury.

EXAM:
THORACIC SPINE - 2 VIEW + SWIMMERS

[t t-spine a.p.]
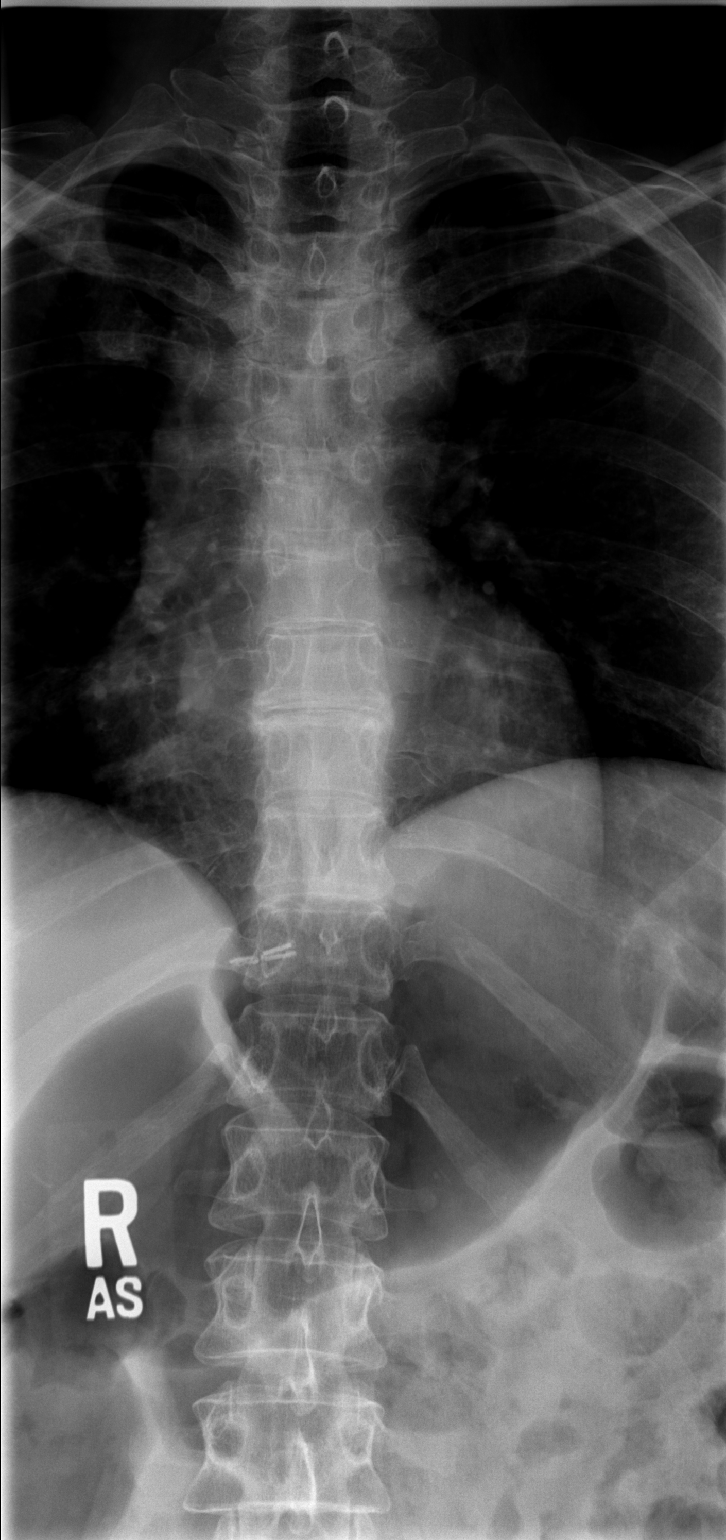

[t t-spine lat *]
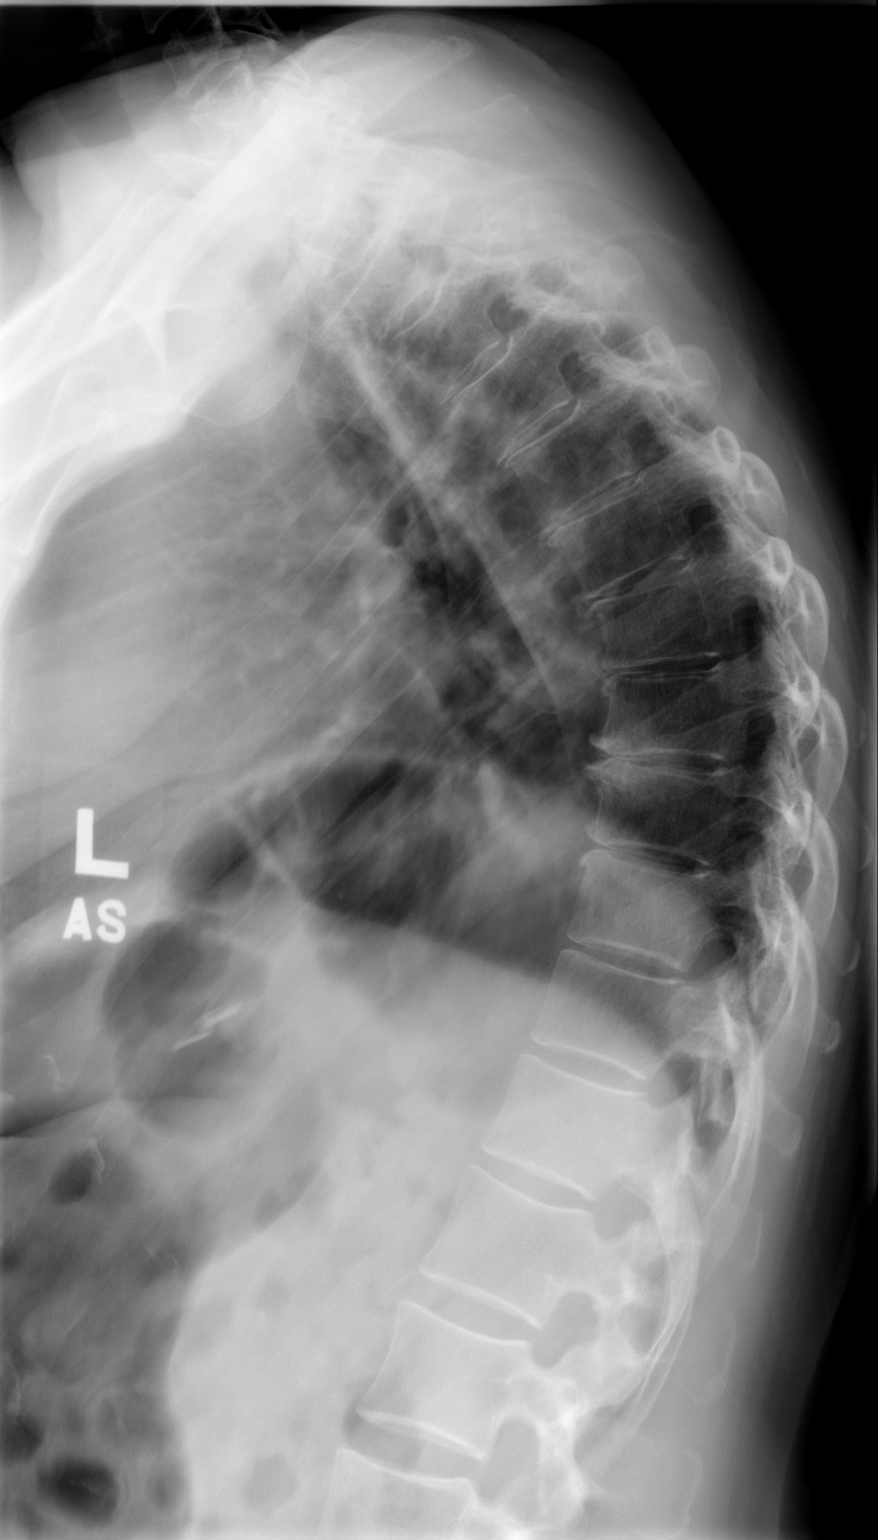

[t swimmers]
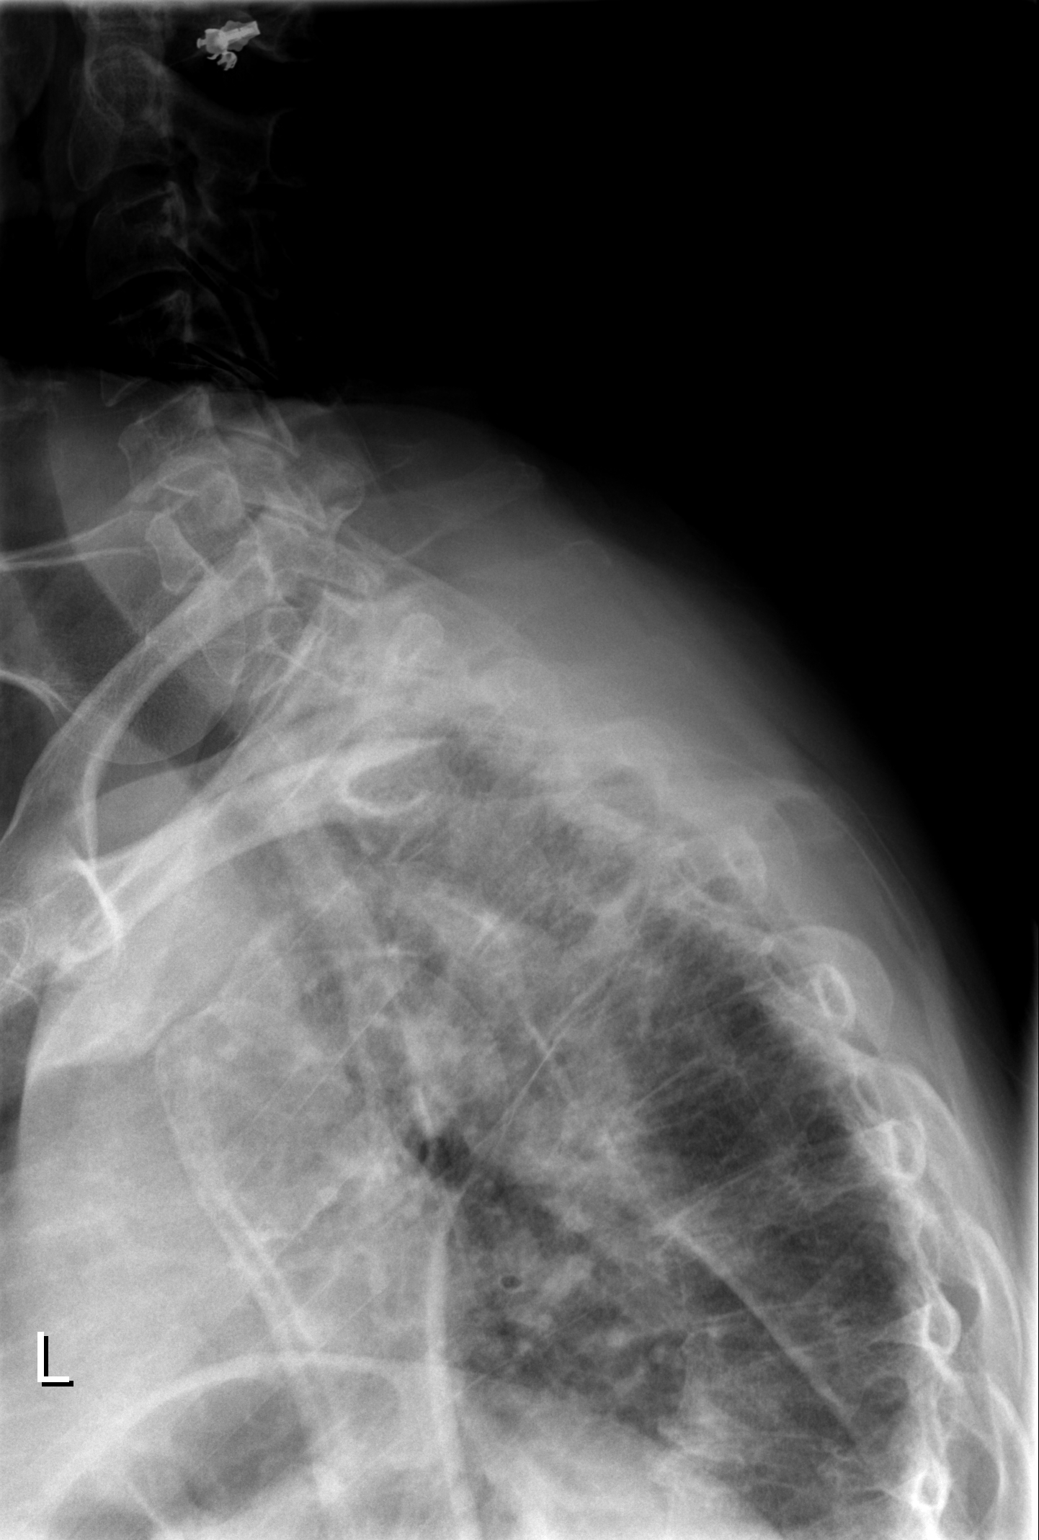

[3 of 3 positions shown; findings below may reference images not displayed]

FINDINGS: No fracture or malalignment is identified. There is exaggeration of
the normal thoracic kyphosis. Multilevel loss of disc space height
and endplate spurring appear worst at T8-9.
IMPRESSION: No acute abnormality.

Exaggeration of thoracic kyphosis.

Degenerative disc disease most notable at T8-9.

## 2017-04-11 ENCOUNTER — Other Ambulatory Visit: Payer: Self-pay | Admitting: Family Medicine

## 2017-04-11 DIAGNOSIS — Z1231 Encounter for screening mammogram for malignant neoplasm of breast: Secondary | ICD-10-CM

## 2017-05-15 ENCOUNTER — Ambulatory Visit
Admission: RE | Admit: 2017-05-15 | Discharge: 2017-05-15 | Disposition: A | Discharge status: Home / Self Care | Payer: 59 | Source: Ambulatory Visit | Attending: Family Medicine | Admitting: Family Medicine

## 2017-05-15 DIAGNOSIS — Z1231 Encounter for screening mammogram for malignant neoplasm of breast: Secondary | ICD-10-CM

## 2018-04-25 ENCOUNTER — Other Ambulatory Visit: Payer: Self-pay | Admitting: Family Medicine

## 2018-04-25 DIAGNOSIS — Z1231 Encounter for screening mammogram for malignant neoplasm of breast: Secondary | ICD-10-CM

## 2018-05-20 ENCOUNTER — Ambulatory Visit: Payer: 59

## 2018-05-22 ENCOUNTER — Ambulatory Visit
Admission: RE | Admit: 2018-05-22 | Discharge: 2018-05-22 | Disposition: A | Discharge status: Home / Self Care | Payer: 59 | Source: Ambulatory Visit | Attending: Family Medicine | Admitting: Family Medicine

## 2018-05-22 DIAGNOSIS — Z1231 Encounter for screening mammogram for malignant neoplasm of breast: Secondary | ICD-10-CM

## 2018-12-05 ENCOUNTER — Encounter: Payer: Self-pay | Admitting: Internal Medicine

## 2018-12-05 ENCOUNTER — Ambulatory Visit (INDEPENDENT_AMBULATORY_CARE_PROVIDER_SITE_OTHER): Payer: Self-pay | Admitting: Internal Medicine

## 2018-12-05 DIAGNOSIS — Z9189 Other specified personal risk factors, not elsewhere classified: Secondary | ICD-10-CM

## 2018-12-05 DIAGNOSIS — Z7185 Encounter for immunization safety counseling: Secondary | ICD-10-CM

## 2018-12-05 DIAGNOSIS — Z7189 Other specified counseling: Secondary | ICD-10-CM

## 2018-12-05 DIAGNOSIS — Z23 Encounter for immunization: Secondary | ICD-10-CM

## 2018-12-05 DIAGNOSIS — Z298 Encounter for other specified prophylactic measures: Secondary | ICD-10-CM

## 2018-12-05 DIAGNOSIS — Z789 Other specified health status: Secondary | ICD-10-CM

## 2018-12-05 DIAGNOSIS — Z7184 Encounter for health counseling related to travel: Secondary | ICD-10-CM

## 2018-12-05 MED ORDER — ATOVAQUONE-PROGUANIL HCL 250-100 MG PO TABS: 1.0000 | ORAL_TABLET | Freq: Every day | ORAL | 0 refills | Status: DC

## 2018-12-05 MED ORDER — AZITHROMYCIN 500 MG PO TABS: 1000.0000 mg | ORAL_TABLET | Freq: Once | ORAL | 0 refills | Status: AC

## 2018-12-05 NOTE — Patient Instructions (Signed)
[facility]                [address], [address]                   [place], [place] [address]                      Phone: [phone]                        Fax: [phone]   Planned departure date: February 2020          Planned return date: 10 days Countries of travel: [place]    Guidelines for the Prevention & Treatment of Traveler's Diarrhea  Prevention: "Boil it, Peel it, Adriana Simas it, or Forget it"   the fewer chances -> lower risk: try to stick to food & water precautions as much as possible"   If it's "piping hot"; it is probably okay, if not, it may not be   Treatment   1) You should always take care to drink lots of fluids in order to avoid dehydration   2) You should bring medications with you in case you come down with a case of diarrhea   3) OTC = bring pepto-bismol - can take with initial abdominal symptoms;                    Imodium - can help slow down your intestinal tract, can help relief cramps                    and diarrhea, can take if no bloody diarrhea  Use azithromycin if needed for traveler's diarrhea  Guidelines for the Prevention of Malaria  Avoidance:  -fewer mosquito bites = lower risk. Mosquitos can bite at night as well as daytime  -cover up (long sleeve clothing), mosquito nets, screens  -Insect repellent for your skin ( DEET containing lotion > 20%): for clothes ( permethrin spray) www.insectshield.com (626) 142-3271) for pretreated permethrin  2 days prior to travel, start malarone, daily dose starting 1-2 days before entering endemic area, ending 7 days after leaving area for malaria prevention.   Immunizations received today: Td and Typhoid (parenteral)  Future immunizations, if indicated none indicated   Prior to travel:  1) Be sure to pick up appropriate prescriptions, including medicine you take daily. Do not expect to be able to fill your prescriptions abroad.  2) Strongly consider  obtaining traveler's insurance, including emergency evacuation insurance. Most plans in the [place] do not cover participants abroad. (see below for resources)  3) Register at the appropriate [place] embassy or consulate with travel dates so they are aware of your presence in-country and for helpful advice during travel using the [employer] (STEP, GuyGalaxy.si).  4) Leave contact information with a relative or friend.  5) Keep a Corporate treasurer, credit cards in case they become lost or stolen  6) Inform your credit card company that you will be travelling abroad   During travel:  1) If you become ill and need medical advice, the [place] [employer] of the country you are traveling in general provides a list of English speaking doctors.  We are also available on MyChart for remote consultation if you register prior to travel. 2) Avoid motorcycles or scooters when at all possible. Traffic laws in many countries are lax and accidents  occur frequently.  3) Do not take any unnecessary risks that you wouldn't do at home.   Resources:  -Country specific information: BlindResource.ca or GreenNylon.com.cy  -Press photographer (DEET, mosquito nets): REI, [employer] store, [employer], Galliano insurance options: gatewayplans.com; http://clayton-rivera.info/; travelguard.com or Good Pilgrim's Pride, gninsurance.com or info@gninsurance .com, [id number].   Post Travel:  If you return from your trip ill, call your primary care doctor or our travel clinic @ [phone].   Enjoy your trip and know that with proper pre-travel preparation, most people have an enjoyable and uninterrupted trip!

## 2018-12-05 NOTE — Progress Notes (Signed)
Patient ID: Kristine Brooks, female   DOB: [birth year], 68 y.o.   MRN: [id number] Subjective:   Kristine Brooks is a 68 y.o. female who presents to the Infectious Disease clinic for travel consultation. Planned departure date: February 2020          Planned return date: 10 days Countries of travel: [place]  Areas in country: urban   Accommodations: unknown Purpose of travel: missionary work Prior travel out of [place]: yes Extensive travel in [place]    Objective:   Medications: reviewed    Assessment:   No contraindications to travel. none     Plan:    Issues discussed: environmental concerns, insect-borne illnesses, Japanese encephalitis, malaria, motion sickness, MVA safety, rabies, safe food/water, traveler's diarrhea, website/handouts for more information, what to do if ill upon return and what to do if ill while there. Immunizations recommended: Td and Typhoid (parenteral). Malaria prophylaxis: malarone, daily dose starting 1-2 days before entering endemic area, ending 7 days after leaving area Traveler's diarrhea prophylaxis: azithromycin. Total duration of visit: 1 Hour. Total time spent on education, counseling, coordination of care: 30 Minutes.

## 2019-04-29 ENCOUNTER — Other Ambulatory Visit: Payer: Self-pay | Admitting: Family Medicine

## 2019-04-29 DIAGNOSIS — Z1231 Encounter for screening mammogram for malignant neoplasm of breast: Secondary | ICD-10-CM

## 2019-06-09 ENCOUNTER — Other Ambulatory Visit: Payer: Self-pay | Admitting: Family Medicine

## 2019-06-09 ENCOUNTER — Other Ambulatory Visit (HOSPITAL_COMMUNITY)
Admission: RE | Admit: 2019-06-09 | Discharge: 2019-06-09 | Disposition: A | Discharge status: Home / Self Care | Payer: Medicare Other | Source: Ambulatory Visit | Attending: Family Medicine | Admitting: Family Medicine

## 2019-06-09 DIAGNOSIS — Z01411 Encounter for gynecological examination (general) (routine) with abnormal findings: Secondary | ICD-10-CM | POA: Diagnosis present

## 2019-06-15 LAB — CYTOLOGY - PAP
Diagnosis: NEGATIVE
HPV: NOT DETECTED

## 2019-06-17 ENCOUNTER — Ambulatory Visit
Admission: RE | Admit: 2019-06-17 | Discharge: 2019-06-17 | Disposition: A | Discharge status: Home / Self Care | Payer: Medicare Other | Source: Ambulatory Visit | Attending: Family Medicine | Admitting: Family Medicine

## 2019-06-17 ENCOUNTER — Other Ambulatory Visit: Payer: Self-pay

## 2019-06-17 DIAGNOSIS — Z1231 Encounter for screening mammogram for malignant neoplasm of breast: Secondary | ICD-10-CM

## 2019-08-28 ENCOUNTER — Ambulatory Visit
Admission: RE | Admit: 2019-08-28 | Discharge: 2019-08-28 | Disposition: A | Discharge status: Home / Self Care | Payer: Medicare Other | Source: Ambulatory Visit | Attending: Family Medicine | Admitting: Family Medicine

## 2019-08-28 ENCOUNTER — Other Ambulatory Visit: Payer: Self-pay | Admitting: Family Medicine

## 2019-08-28 DIAGNOSIS — M549 Dorsalgia, unspecified: Secondary | ICD-10-CM

## 2019-12-04 HISTORY — PX: SPINAL FUSION: SHX223

## 2020-01-03 ENCOUNTER — Ambulatory Visit: Payer: Medicare Other

## 2020-01-08 ENCOUNTER — Ambulatory Visit: Payer: Medicare Other

## 2020-05-06 ENCOUNTER — Other Ambulatory Visit: Payer: Self-pay | Admitting: Neurological Surgery

## 2020-05-06 ENCOUNTER — Telehealth: Payer: Self-pay | Admitting: Nurse Practitioner

## 2020-05-06 DIAGNOSIS — M545 Low back pain, unspecified: Secondary | ICD-10-CM

## 2020-05-06 NOTE — Telephone Encounter (Signed)
Phone call to patient to verify medication list and allergies for myelogram procedure. Pt aware she will not need to hold any medication for this procedure. Pre and post procedure instructions reviewed with pt. Pt verbalized understanding.

## 2020-05-18 ENCOUNTER — Ambulatory Visit
Admission: RE | Admit: 2020-05-18 | Discharge: 2020-05-18 | Disposition: A | Discharge status: Home / Self Care | Payer: Medicare Other | Source: Ambulatory Visit | Attending: Neurological Surgery | Admitting: Neurological Surgery

## 2020-05-18 ENCOUNTER — Other Ambulatory Visit: Payer: Self-pay

## 2020-05-18 DIAGNOSIS — M545 Low back pain, unspecified: Secondary | ICD-10-CM

## 2020-05-18 MED ORDER — ONDANSETRON HCL 4 MG/2ML IJ SOLN
4.0000 mg | Freq: Once | INTRAMUSCULAR | Status: AC
  Administered 2020-05-18: 4 mg via INTRAMUSCULAR

## 2020-05-18 MED ORDER — DIAZEPAM 5 MG PO TABS
5.0000 mg | ORAL_TABLET | Freq: Once | ORAL | Status: AC
  Administered 2020-05-18: 5 mg via ORAL

## 2020-05-18 MED ORDER — IOPAMIDOL (ISOVUE-M 200) INJECTION 41%
18.0000 mL | Freq: Once | INTRAMUSCULAR | Status: AC
  Administered 2020-05-18: 18 mL via INTRATHECAL

## 2020-05-18 MED ORDER — MEPERIDINE HCL 100 MG/ML IJ SOLN
50.0000 mg | Freq: Once | INTRAMUSCULAR | Status: AC
  Administered 2020-05-18: 50 mg via INTRAMUSCULAR

## 2020-05-18 MED ORDER — ONDANSETRON HCL 4 MG/2ML IJ SOLN: 4.0000 mg | Freq: Four times a day (QID) | INTRAMUSCULAR | Status: DC | PRN

## 2020-05-18 NOTE — Discharge Instructions (Signed)

## 2020-05-24 ENCOUNTER — Other Ambulatory Visit: Payer: Self-pay | Admitting: Neurological Surgery

## 2020-06-09 ENCOUNTER — Other Ambulatory Visit: Payer: Self-pay | Admitting: Family Medicine

## 2020-06-09 DIAGNOSIS — Z1231 Encounter for screening mammogram for malignant neoplasm of breast: Secondary | ICD-10-CM

## 2020-06-20 ENCOUNTER — Other Ambulatory Visit: Payer: Self-pay

## 2020-06-20 ENCOUNTER — Ambulatory Visit
Admission: RE | Admit: 2020-06-20 | Discharge: 2020-06-20 | Disposition: A | Discharge status: Home / Self Care | Payer: Medicare Other | Source: Ambulatory Visit | Attending: Family Medicine | Admitting: Family Medicine

## 2020-06-20 DIAGNOSIS — Z1231 Encounter for screening mammogram for malignant neoplasm of breast: Secondary | ICD-10-CM

## 2020-07-01 NOTE — Progress Notes (Signed)
Your procedure is scheduled on Monday, August 9th.  Report to [facility] Main Entrance "A" at 5:30 A.M., and check in at the Admitting office.  Call this number if you have problems the morning of surgery:  [phone]  Call [phone] if you have any questions prior to your surgery date Monday-Friday 8am-4pm   Remember:  Do not eat or drink after midnight the night before your surgery   Take these medicines the morning of surgery with A SIP OF WATER  aliskiren (TEKTURNA)  amLODipine (NORVASC)  fexofenadine (ALLEGRA) fluticasone (FLONASE)/nasal spray  If needed - HYDROcodone-acetaminophen (NORCO/VICODIN)    As of today, STOP taking any Aspirin (unless otherwise instructed by your surgeon) Aleve, Naproxen, Ibuprofen, Motrin, Advil, Goody's, BC's, all herbal medications, fish oil, and all vitamins.                     Do not wear jewelry, make up, or nail polish            Do not wear lotions, powders, perfume, or deodorant.            Do not shave 48 hours prior to surgery.             Do not bring valuables to the hospital.            [facility] is not responsible for any belongings or valuables.  Do NOT Smoke (Tobacco/Vaping) or drink Alcohol 24 hours prior to your procedure If you use a CPAP at night, you may bring all equipment for your overnight stay.   Contacts, glasses, dentures or bridgework may not be worn into surgery.      For patients admitted to the hospital, discharge time will be determined by your treatment team.   Patients discharged the day of surgery will not be allowed to drive home, and someone needs to stay with them for 24 hours.  Special instructions:   [facility]- Preparing For Surgery  Before surgery, you can play an important role. Because skin is not sterile, your skin needs to be as free of germs as possible. You can reduce the number of germs on your skin by washing with CHG (chlorahexidine gluconate) Soap before surgery.  CHG is an antiseptic  cleaner which kills germs and bonds with the skin to continue killing germs even after washing.    Oral Hygiene is also important to reduce your risk of infection.  Remember - BRUSH YOUR TEETH THE MORNING OF SURGERY WITH YOUR REGULAR TOOTHPASTE  Please do not use if you have an allergy to CHG or antibacterial soaps. If your skin becomes reddened/irritated stop using the CHG.  Do not shave (including legs and underarms) for at least 48 hours prior to first CHG shower. It is OK to shave your face.  Please follow these instructions carefully.   1. Shower the NIGHT BEFORE SURGERY and the MORNING OF SURGERY with CHG Soap.   2. If you chose to wash your hair, wash your hair first as usual with your normal shampoo.  3. After you shampoo, rinse your hair and body thoroughly to remove the shampoo.  4. Use CHG as you would any other liquid soap. You can apply CHG directly to the skin and wash gently with a scrungie or a clean washcloth.   5. Apply the CHG Soap to your body ONLY FROM THE NECK DOWN.  Do not use on open wounds or open sores. Avoid contact with your eyes, ears, mouth  and genitals (private parts). Wash Face and genitals (private parts)  with your normal soap.   6. Wash thoroughly, paying special attention to the area where your surgery will be performed.  7. Thoroughly rinse your body with warm water from the neck down.  8. DO NOT shower/wash with your normal soap after using and rinsing off the CHG Soap.  9. Pat yourself dry with a CLEAN TOWEL.  10. Wear CLEAN PAJAMAS to bed the night before surgery  11. Place CLEAN SHEETS on your bed the night of your first shower and DO NOT SLEEP WITH PETS.  Day of Surgery: Wear Clean/Comfortable clothing the morning of surgery Do not apply any deodorants/lotions.   Remember to brush your teeth WITH YOUR REGULAR TOOTHPASTE.   Please read over the following fact sheets that you were given.

## 2020-07-04 ENCOUNTER — Encounter (HOSPITAL_COMMUNITY): Payer: Self-pay

## 2020-07-04 ENCOUNTER — Ambulatory Visit (HOSPITAL_COMMUNITY)
Admission: RE | Admit: 2020-07-04 | Discharge: 2020-07-04 | Disposition: A | Discharge status: Home / Self Care | Payer: Medicare Other | Source: Ambulatory Visit | Attending: Neurological Surgery | Admitting: Neurological Surgery

## 2020-07-04 ENCOUNTER — Encounter (HOSPITAL_COMMUNITY)
Admission: RE | Admit: 2020-07-04 | Discharge: 2020-07-04 | Disposition: A | Discharge status: Home / Self Care | Payer: Medicare Other | Source: Ambulatory Visit | Attending: Neurological Surgery | Admitting: Neurological Surgery

## 2020-07-04 ENCOUNTER — Other Ambulatory Visit: Payer: Self-pay

## 2020-07-04 DIAGNOSIS — J45909 Unspecified asthma, uncomplicated: Secondary | ICD-10-CM | POA: Insufficient documentation

## 2020-07-04 DIAGNOSIS — M4310 Spondylolisthesis, site unspecified: Secondary | ICD-10-CM

## 2020-07-04 DIAGNOSIS — Z01812 Encounter for preprocedural laboratory examination: Secondary | ICD-10-CM | POA: Diagnosis present

## 2020-07-04 DIAGNOSIS — I10 Essential (primary) hypertension: Secondary | ICD-10-CM | POA: Diagnosis not present

## 2020-07-04 DIAGNOSIS — Z79899 Other long term (current) drug therapy: Secondary | ICD-10-CM | POA: Diagnosis not present

## 2020-07-04 HISTORY — DX: Bronchitis, not specified as acute or chronic: J40

## 2020-07-04 HISTORY — DX: Unspecified asthma, uncomplicated: J45.909

## 2020-07-04 HISTORY — DX: Renal tubulo-interstitial disease, unspecified: N15.9

## 2020-07-04 HISTORY — DX: Age-related osteoporosis without current pathological fracture: M81.0

## 2020-07-04 HISTORY — DX: Cardiac murmur, unspecified: R01.1

## 2020-07-04 HISTORY — DX: Essential (primary) hypertension: I10

## 2020-07-04 HISTORY — DX: Unspecified osteoarthritis, unspecified site: M19.90

## 2020-07-04 HISTORY — DX: Gastro-esophageal reflux disease without esophagitis: K21.9

## 2020-07-04 HISTORY — DX: Pneumonia, unspecified organism: J18.9

## 2020-07-04 LAB — TYPE AND SCREEN
ABO/RH(D): O POS
Antibody Screen: NEGATIVE

## 2020-07-04 LAB — CBC WITH DIFFERENTIAL/PLATELET
Abs Immature Granulocytes: 0.01 10*3/uL (ref 0.00–0.07)
Basophils Absolute: 0 10*3/uL (ref 0.0–0.1)
Basophils Relative: 0 %
Eosinophils Absolute: 0.1 10*3/uL (ref 0.0–0.5)
Eosinophils Relative: 1 %
HCT: 40.6 % (ref 36.0–46.0)
Hemoglobin: 13.1 g/dL (ref 12.0–15.0)
Immature Granulocytes: 0 %
Lymphocytes Relative: 27 %
Lymphs Abs: 1.4 10*3/uL (ref 0.7–4.0)
MCH: 28.7 pg (ref 26.0–34.0)
MCHC: 32.3 g/dL (ref 30.0–36.0)
MCV: 88.8 fL (ref 80.0–100.0)
Monocytes Absolute: 0.5 10*3/uL (ref 0.1–1.0)
Monocytes Relative: 10 %
Neutro Abs: 3 10*3/uL (ref 1.7–7.7)
Neutrophils Relative %: 62 %
Platelets: 287 10*3/uL (ref 150–400)
RBC: 4.57 MIL/uL (ref 3.87–5.11)
RDW: 12.4 % (ref 11.5–15.5)
WBC: 5 10*3/uL (ref 4.0–10.5)
nRBC: 0 % (ref 0.0–0.2)

## 2020-07-04 LAB — BASIC METABOLIC PANEL
Anion gap: 9 (ref 5–15)
BUN: 9 mg/dL (ref 8–23)
CO2: 26 mmol/L (ref 22–32)
Calcium: 10.3 mg/dL (ref 8.9–10.3)
Chloride: 102 mmol/L (ref 98–111)
Creatinine, Ser: 0.59 mg/dL (ref 0.44–1.00)
GFR calc Af Amer: >60 mL/min (ref >60)
GFR calc non Af Amer: >60 mL/min (ref >60)
Glucose, Bld: 91 mg/dL (ref 70–99)
Potassium: 4 mmol/L (ref 3.5–5.1)
Sodium: 137 mmol/L (ref 135–145)

## 2020-07-04 LAB — PROTIME-INR
INR: 1 (ref 0.8–1.2)
Prothrombin Time: 12.5 seconds (ref 11.4–15.2)

## 2020-07-04 LAB — SURGICAL PCR SCREEN
MRSA, PCR: NEGATIVE
Staphylococcus aureus: NEGATIVE

## 2020-07-04 NOTE — Progress Notes (Signed)
PCP - Dr. Mila Palmer [facility] ) Cardiologist - denies  PPM/ICD - denies  Chest x-ray - 07/04/2020 EKG - 07/04/2020 Stress Test - per patient, 10 or more years ago, it was normal ECHO - denies Cardiac Cath - denies  Sleep Study - per patient, told years ago it was marginal sleep apnea but has loss weight and hasn't had any problems since then  DM: denies  Blood Thinner Instructions: N/A Aspirin Instructions: N/A  ERAS Protcol - No  COVID TEST- Scheduled for 07/07/2020. Patient verbalized understanding of self-quarantine instructions, appointment time and place.  Anesthesia review: YES, records requested from PCP (last office note)  Patient denies shortness of breath, fever, cough and chest pain at PAT appointment  All instructions explained to the patient, with a verbal understanding of the material. Patient agrees to go over the instructions while at home for a better understanding. Patient also instructed to self quarantine after being tested for COVID-19. The opportunity to ask questions was provided.

## 2020-07-05 NOTE — Anesthesia Preprocedure Evaluation (Addendum)
Anesthesia Evaluation  Patient identified by MRN, date of birth, ID band Patient awake    Reviewed: Allergy & Precautions, NPO status , Patient's Chart, lab work & pertinent test results  Airway Mallampati: II  TM Distance: >3 FB Neck ROM: Full    Dental  (+) Teeth Intact, Dental Advisory Given, Chipped,    Pulmonary asthma ,    Pulmonary exam normal breath sounds clear to auscultation       Cardiovascular hypertension, Pt. on medications Normal cardiovascular exam Rhythm:Regular Rate:Normal     Neuro/Psych  Spondylolisthesis negative psych ROS   GI/Hepatic Neg liver ROS, GERD  Controlled,S/p nissen fundoplication    Endo/Other  negative endocrine ROS  Renal/GU negative Renal ROS     Musculoskeletal  (+) Arthritis ,   Abdominal   Peds  Hematology negative hematology ROS (+)   Anesthesia Other Findings   Reproductive/Obstetrics                            Anesthesia Physical Anesthesia Plan  ASA: II  Anesthesia Plan: General   Post-op Pain Management:    Induction: Intravenous  PONV Risk Score and Plan: 3 and Midazolam, Dexamethasone and Ondansetron  Airway Management Planned: Oral ETT  Additional Equipment:   Intra-op Plan:   Post-operative Plan: Extubation in OR  Informed Consent: I have reviewed the patients History and Physical, chart, labs and discussed the procedure including the risks, benefits and alternatives for the proposed anesthesia with the patient or authorized representative who has indicated his/her understanding and acceptance.     Dental advisory given  Plan Discussed with: CRNA  Anesthesia Plan Comments: (PAT note written by Shonna Chock, PA-C. )      Anesthesia Quick Evaluation

## 2020-07-05 NOTE — Progress Notes (Signed)
Anesthesia Chart Review:  Case: [id number] Date/Time: 07/11/20 0715   Procedure: PLIF - L4-L5 (N/A Back)   Anesthesia type: General   Pre-op diagnosis: Spondylolisthesis   [facility]: [facility] ROOM 21 / [facility] OR   Surgeons: Tia Alert, MD      DISCUSSION: Patient is a 69 year old female scheduled for the above procedure.  History includes never smoker, HTN, murmur, asthma, GERD, osteoporosis, left epistaxis (saw ENT Dr. Annalee Genta 12/25/19).  Patient no longer in PAT with anesthesia APP asked to review for upcoming surgery. Preoperative labs and EKG acceptable for OR. She did report a murmur history but denied prior echo. Reportedly murmur was felt to be benign. She was recently evaluated by her PCP Dr. Paulino Rily on 06/17/20 and no murmur documented on physical exam. Dr. Paulino Rily is aware of surgery plans. Patient denied SOB and chest pain. She saw a cardiologist Lottie Rater, MD in 10/2013 for family history of CAD without ischemic symptoms and had a normal nuclear stress test. No murmur documented on his exam.    Presurgical COVID-19 test is scheduled for 07/07/2020.  Anesthesia team to evaluate on the day of surgery.   VS: BP (!) 132/73   Pulse 64   Temp 36.7 C (Oral)   Resp 17   Ht 5\' 3"  (1.6 m)   Wt 63.1 kg   LMP  (LMP Unknown)   SpO2 100%   BMI 24.66 kg/m    PROVIDERS: , MD is PCP [facility] [place]). Last visit 06/17/20. She is aware of surgery plans.  - 06/19/20, MD is ENT (see [facility] Care Everywhere).  She was evaluated 12/25/2019 for recurrent left epistaxis.  He noted she has a "deviated nasal septum which significantly restricts her nasal airway."  He recommended epistaxis precautions including use of saline nasal spray or topical saline gel, avoiding nose blowing or trauma, and discontinuing topical nasal steroid sprays. He had her discontinue low-dose ASA at that time. Follow-up as needed but could consider nasal airway surgery if the future if needed.     LABS: Labs reviewed: Acceptable for surgery. (all labs ordered are listed, but only abnormal results are displayed)  Labs Reviewed  SURGICAL PCR SCREEN  BASIC METABOLIC PANEL  CBC WITH DIFFERENTIAL/PLATELET  PROTIME-INR  TYPE AND SCREEN     IMAGES: CXR 07/04/20: FINDINGS: No consolidation, features of edema, pneumothorax, or effusion. Pulmonary vascularity is normally distributed. The cardiomediastinal contours are unremarkable. No acute osseous or soft tissue abnormality. IMPRESSION: No acute cardiopulmonary abnormality.   CT L-spine 05/18/20: IMPRESSION: - L4-5: Bilateral facet osteoarthritis with pronounced ligamentous hypertrophy. 3 mm of anterolisthesis, increasing to 4 mm with standing flexion. Circumferential protrusion of the disc. Severe multifactorial spinal stenosis that could cause neural compression on either or both sides. - L3-4: Mild disc bulge and ligamentous hypertrophy. Mild lateral recess narrowing without visible neural compression.   EKG: 07/04/20:  Normal sinus rhythm Low voltage QRS Borderline ECG No previous tracing Confirmed by 09/03/20 [phone]) on 07/04/2020 3:53:12 PM   CV: Nuclear stress test 10/16/13 [facility]): IMPRESSION:  This is a normal study  Normal EF  No prior studies available for comparison.    Past Medical History:  Diagnosis Date  . 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 Asthma    per patient hx of gastric asthma  . Bronchitis   . GERD (gastroesophageal reflux disease)   . Heart murmur    per patient benign murmur that has been  here all her life  . Hypertension   . Kidney infection    per patient at the age of 69  . Osteoporosis   . Pneumonia     Past Surgical History:  Procedure Laterality Date  . BREAST BIOPSY Right 2016  . TONSILLECTOMY    . TUBAL LIGATION     per patient 1984 or 1985    MEDICATIONS: . adapalene (DIFFERIN) 0.1 % gel  . aliskiren (TEKTURNA) 300 MG tablet  .  amLODipine (NORVASC) 10 MG tablet  . Calcium Carbonate-Vitamin D3 (CALCIUM 600-D) 600-400 MG-UNIT TABS  . Cholecalciferol (VITAMIN D) 2000 UNITS tablet  . denosumab (PROLIA) 60 MG/ML SOSY injection  . fexofenadine (ALLEGRA) 180 MG tablet  . fluticasone (FLONASE) 50 MCG/ACT nasal spray  . GLUCOSAMINE SULFATE PO  . HYDROcodone-acetaminophen (NORCO/VICODIN) 5-325 MG tablet  . metroNIDAZOLE (METROCREAM) 0.75 % cream  . Multiple Vitamin (MULTIVITAMIN WITH MINERALS) TABS tablet  . Probiotic Product (PROBIOTIC PO)  . psyllium (METAMUCIL) 0.52 g capsule  . rosuvastatin (CRESTOR) 5 MG tablet   No current facility-administered medications for this encounter.    Shonna Chock, PA-C Surgical Short Stay/Anesthesiology [facility] Phone [phone] [facility] Phone [phone] 07/05/2020 3:37 PM

## 2020-07-07 ENCOUNTER — Other Ambulatory Visit (HOSPITAL_COMMUNITY)
Admission: RE | Admit: 2020-07-07 | Discharge: 2020-07-07 | Disposition: A | Discharge status: Home / Self Care | Payer: Medicare Other | Source: Ambulatory Visit | Attending: Neurological Surgery | Admitting: Neurological Surgery

## 2020-07-07 DIAGNOSIS — Z01812 Encounter for preprocedural laboratory examination: Secondary | ICD-10-CM | POA: Diagnosis present

## 2020-07-07 DIAGNOSIS — Z20822 Contact with and (suspected) exposure to covid-19: Secondary | ICD-10-CM | POA: Diagnosis not present

## 2020-07-07 LAB — SARS CORONAVIRUS 2 (TAT 6-24 HRS): SARS Coronavirus 2: NEGATIVE

## 2020-07-11 ENCOUNTER — Ambulatory Visit (HOSPITAL_COMMUNITY): Payer: Medicare Other | Admitting: Vascular Surgery

## 2020-07-11 ENCOUNTER — Other Ambulatory Visit: Payer: Self-pay

## 2020-07-11 ENCOUNTER — Ambulatory Visit (HOSPITAL_COMMUNITY)
Admission: RE | Admit: 2020-07-11 | Discharge: 2020-07-12 | Disposition: A | Discharge status: Home / Self Care | Payer: Medicare Other | Attending: Neurological Surgery | Admitting: Neurological Surgery

## 2020-07-11 ENCOUNTER — Encounter (HOSPITAL_COMMUNITY): Payer: Self-pay | Admitting: Neurological Surgery

## 2020-07-11 ENCOUNTER — Ambulatory Visit (HOSPITAL_COMMUNITY): Payer: Medicare Other

## 2020-07-11 ENCOUNTER — Encounter (HOSPITAL_COMMUNITY)
Admission: RE | Disposition: A | Discharge status: Home / Self Care | Payer: Self-pay | Source: Home / Self Care | Attending: Neurological Surgery

## 2020-07-11 ENCOUNTER — Ambulatory Visit (HOSPITAL_COMMUNITY): Payer: Medicare Other | Admitting: Anesthesiology

## 2020-07-11 DIAGNOSIS — M7138 Other bursal cyst, other site: Secondary | ICD-10-CM | POA: Insufficient documentation

## 2020-07-11 DIAGNOSIS — M48061 Spinal stenosis, lumbar region without neurogenic claudication: Secondary | ICD-10-CM | POA: Insufficient documentation

## 2020-07-11 DIAGNOSIS — M4316 Spondylolisthesis, lumbar region: Secondary | ICD-10-CM | POA: Insufficient documentation

## 2020-07-11 DIAGNOSIS — Z882 Allergy status to sulfonamides status: Secondary | ICD-10-CM | POA: Diagnosis not present

## 2020-07-11 DIAGNOSIS — Z79899 Other long term (current) drug therapy: Secondary | ICD-10-CM | POA: Insufficient documentation

## 2020-07-11 DIAGNOSIS — Z7983 Long term (current) use of bisphosphonates: Secondary | ICD-10-CM | POA: Diagnosis not present

## 2020-07-11 DIAGNOSIS — I10 Essential (primary) hypertension: Secondary | ICD-10-CM | POA: Insufficient documentation

## 2020-07-11 DIAGNOSIS — Z419 Encounter for procedure for purposes other than remedying health state, unspecified: Secondary | ICD-10-CM

## 2020-07-11 DIAGNOSIS — Z888 Allergy status to other drugs, medicaments and biological substances status: Secondary | ICD-10-CM | POA: Insufficient documentation

## 2020-07-11 DIAGNOSIS — Z881 Allergy status to other antibiotic agents status: Secondary | ICD-10-CM | POA: Insufficient documentation

## 2020-07-11 DIAGNOSIS — J45909 Unspecified asthma, uncomplicated: Secondary | ICD-10-CM | POA: Insufficient documentation

## 2020-07-11 DIAGNOSIS — M479 Spondylosis, unspecified: Secondary | ICD-10-CM | POA: Insufficient documentation

## 2020-07-11 DIAGNOSIS — Z981 Arthrodesis status: Secondary | ICD-10-CM

## 2020-07-11 LAB — ABO/RH: ABO/RH(D): O POS

## 2020-07-11 SURGERY — POSTERIOR LUMBAR FUSION 1 LEVEL
Anesthesia: General | Site: Back

## 2020-07-11 MED ORDER — ONDANSETRON HCL 4 MG/2ML IJ SOLN: 4.0000 mg | Freq: Four times a day (QID) | INTRAMUSCULAR | Status: DC | PRN

## 2020-07-11 MED ORDER — VITAMIN D 25 MCG (1000 UNIT) PO TABS
2000.0000 [IU] | ORAL_TABLET | Freq: Every day | ORAL | Status: DC
  Administered 2020-07-12: 2000 [IU] via ORAL
  Filled 2020-07-11: qty 2

## 2020-07-11 MED ORDER — PHENYLEPHRINE 40 MCG/ML (10ML) SYRINGE FOR IV PUSH (FOR BLOOD PRESSURE SUPPORT)
PREFILLED_SYRINGE | INTRAVENOUS | Status: AC
  Filled 2020-07-11: qty 10

## 2020-07-11 MED ORDER — PHENYLEPHRINE HCL (PRESSORS) 10 MG/ML IV SOLN
INTRAVENOUS | Status: DC | PRN
  Administered 2020-07-11: 120 ug via INTRAVENOUS

## 2020-07-11 MED ORDER — CHLORHEXIDINE GLUCONATE CLOTH 2 % EX PADS: 6.0000 | MEDICATED_PAD | Freq: Once | CUTANEOUS | Status: DC

## 2020-07-11 MED ORDER — ALISKIREN FUMARATE 150 MG PO TABS
300.0000 mg | ORAL_TABLET | Freq: Every day | ORAL | Status: DC
  Administered 2020-07-12: 300 mg via ORAL
  Filled 2020-07-11: qty 2

## 2020-07-11 MED ORDER — ROCURONIUM BROMIDE 10 MG/ML (PF) SYRINGE
PREFILLED_SYRINGE | INTRAVENOUS | Status: DC | PRN
  Administered 2020-07-11: 50 mg via INTRAVENOUS
  Administered 2020-07-11: 20 mg via INTRAVENOUS

## 2020-07-11 MED ORDER — HYDROCODONE-ACETAMINOPHEN 5-325 MG PO TABS
1.0000 | ORAL_TABLET | ORAL | Status: DC | PRN
  Administered 2020-07-11 – 2020-07-12 (×3): 1 via ORAL
  Filled 2020-07-11 (×4): qty 1

## 2020-07-11 MED ORDER — CEFAZOLIN SODIUM-DEXTROSE 2-4 GM/100ML-% IV SOLN
2.0000 g | INTRAVENOUS | Status: AC
  Administered 2020-07-11: 2 g via INTRAVENOUS
  Filled 2020-07-11: qty 100

## 2020-07-11 MED ORDER — BUPIVACAINE HCL (PF) 0.25 % IJ SOLN
INTRAMUSCULAR | Status: AC
  Filled 2020-07-11: qty 30

## 2020-07-11 MED ORDER — CEFAZOLIN SODIUM-DEXTROSE 2-4 GM/100ML-% IV SOLN
2.0000 g | Freq: Three times a day (TID) | INTRAVENOUS | Status: AC
  Administered 2020-07-11 (×2): 2 g via INTRAVENOUS
  Filled 2020-07-11 (×2): qty 100

## 2020-07-11 MED ORDER — ADAPALENE 0.1 % EX GEL: 1.0000 "application " | Freq: Every day | CUTANEOUS | Status: DC

## 2020-07-11 MED ORDER — METHOCARBAMOL 500 MG PO TABS
500.0000 mg | ORAL_TABLET | Freq: Four times a day (QID) | ORAL | Status: DC | PRN
  Administered 2020-07-11 – 2020-07-12 (×3): 500 mg via ORAL
  Filled 2020-07-11 (×3): qty 1

## 2020-07-11 MED ORDER — SENNA 8.6 MG PO TABS
1.0000 | ORAL_TABLET | Freq: Two times a day (BID) | ORAL | Status: DC
  Administered 2020-07-11 – 2020-07-12 (×2): 8.6 mg via ORAL
  Filled 2020-07-11: qty 1

## 2020-07-11 MED ORDER — NALOXEGOL OXALATE 12.5 MG PO TABS
12.5000 mg | ORAL_TABLET | Freq: Every day | ORAL | Status: DC
  Administered 2020-07-11 – 2020-07-12 (×2): 12.5 mg via ORAL
  Filled 2020-07-11 (×2): qty 1

## 2020-07-11 MED ORDER — SODIUM CHLORIDE (PF) 0.9 % IJ SOLN
INTRAMUSCULAR | Status: DC | PRN
  Administered 2020-07-11: 5 mL

## 2020-07-11 MED ORDER — SODIUM CHLORIDE 0.9% FLUSH: 3.0000 mL | INTRAVENOUS | Status: DC | PRN

## 2020-07-11 MED ORDER — 0.9 % SODIUM CHLORIDE (POUR BTL) OPTIME
TOPICAL | Status: DC | PRN
  Administered 2020-07-11: 1000 mL

## 2020-07-11 MED ORDER — ACETAMINOPHEN 500 MG PO TABS
1000.0000 mg | ORAL_TABLET | Freq: Once | ORAL | Status: AC
  Administered 2020-07-11: 1000 mg via ORAL
  Filled 2020-07-11: qty 2

## 2020-07-11 MED ORDER — CHLORHEXIDINE GLUCONATE 0.12 % MT SOLN
15.0000 mL | Freq: Once | OROMUCOSAL | Status: AC
  Administered 2020-07-11: 15 mL via OROMUCOSAL
  Filled 2020-07-11: qty 15

## 2020-07-11 MED ORDER — FENTANYL CITRATE (PF) 100 MCG/2ML IJ SOLN
25.0000 ug | INTRAMUSCULAR | Status: DC | PRN
  Administered 2020-07-11: 25 ug via INTRAVENOUS

## 2020-07-11 MED ORDER — SODIUM CHLORIDE 0.9 % IV SOLN: 250.0000 mL | INTRAVENOUS | Status: DC

## 2020-07-11 MED ORDER — ARTHREX ANGEL - ACD-A SOLUTION (CHARTING ONLY) OPTIME
TOPICAL | Status: DC | PRN
  Administered 2020-07-11: 10 mL via TOPICAL

## 2020-07-11 MED ORDER — METHOCARBAMOL 1000 MG/10ML IJ SOLN
500.0000 mg | Freq: Four times a day (QID) | INTRAVENOUS | Status: DC | PRN
  Filled 2020-07-11: qty 5

## 2020-07-11 MED ORDER — ORAL CARE MOUTH RINSE: 15.0000 mL | Freq: Once | OROMUCOSAL | Status: AC

## 2020-07-11 MED ORDER — KETOROLAC TROMETHAMINE 30 MG/ML IJ SOLN
INTRAMUSCULAR | Status: DC | PRN
  Administered 2020-07-11: 30 mg via INTRAVENOUS

## 2020-07-11 MED ORDER — HEPARIN SODIUM (PORCINE) 1000 UNIT/ML IJ SOLN
INTRAMUSCULAR | Status: DC | PRN
  Administered 2020-07-11: 5000 [IU]

## 2020-07-11 MED ORDER — ACETAMINOPHEN 650 MG RE SUPP: 650.0000 mg | RECTAL | Status: DC | PRN

## 2020-07-11 MED ORDER — LIDOCAINE 2% (20 MG/ML) 5 ML SYRINGE
INTRAMUSCULAR | Status: AC
  Filled 2020-07-11: qty 5

## 2020-07-11 MED ORDER — LIDOCAINE 2% (20 MG/ML) 5 ML SYRINGE
INTRAMUSCULAR | Status: DC | PRN
  Administered 2020-07-11: 60 mg via INTRAVENOUS

## 2020-07-11 MED ORDER — DEXAMETHASONE 4 MG PO TABS
4.0000 mg | ORAL_TABLET | Freq: Four times a day (QID) | ORAL | Status: DC
  Administered 2020-07-11 – 2020-07-12 (×2): 4 mg via ORAL
  Filled 2020-07-11 (×2): qty 1

## 2020-07-11 MED ORDER — MENTHOL 3 MG MT LOZG: 1.0000 | LOZENGE | OROMUCOSAL | Status: DC | PRN

## 2020-07-11 MED ORDER — LACTATED RINGERS IV SOLN: INTRAVENOUS | Status: DC

## 2020-07-11 MED ORDER — THROMBIN 20000 UNITS EX SOLR
CUTANEOUS | Status: DC | PRN
  Administered 2020-07-11: 20 mL

## 2020-07-11 MED ORDER — MORPHINE SULFATE (PF) 2 MG/ML IV SOLN
2.0000 mg | INTRAVENOUS | Status: DC | PRN
  Administered 2020-07-11: 2 mg via INTRAVENOUS
  Filled 2020-07-11: qty 1

## 2020-07-11 MED ORDER — CALCIUM CARBONATE-VITAMIN D3 600-400 MG-UNIT PO TABS: 1.0000 | ORAL_TABLET | Freq: Every day | ORAL | Status: DC

## 2020-07-11 MED ORDER — THROMBIN 5000 UNITS EX SOLR
CUTANEOUS | Status: AC
  Filled 2020-07-11: qty 5000

## 2020-07-11 MED ORDER — FLUTICASONE PROPIONATE 50 MCG/ACT NA SUSP
2.0000 | Freq: Every day | NASAL | Status: DC
  Filled 2020-07-11: qty 16

## 2020-07-11 MED ORDER — DEXAMETHASONE SODIUM PHOSPHATE 10 MG/ML IJ SOLN
10.0000 mg | Freq: Once | INTRAMUSCULAR | Status: AC
  Administered 2020-07-11: 10 mg via INTRAVENOUS
  Filled 2020-07-11: qty 1

## 2020-07-11 MED ORDER — ONDANSETRON HCL 4 MG/2ML IJ SOLN: 4.0000 mg | Freq: Once | INTRAMUSCULAR | Status: DC | PRN

## 2020-07-11 MED ORDER — SODIUM CHLORIDE 0.9% FLUSH
3.0000 mL | Freq: Two times a day (BID) | INTRAVENOUS | Status: DC
  Administered 2020-07-11: 3 mL via INTRAVENOUS

## 2020-07-11 MED ORDER — ONDANSETRON HCL 4 MG PO TABS: 4.0000 mg | ORAL_TABLET | Freq: Four times a day (QID) | ORAL | Status: DC | PRN

## 2020-07-11 MED ORDER — SUGAMMADEX SODIUM 200 MG/2ML IV SOLN
INTRAVENOUS | Status: DC | PRN
  Administered 2020-07-11: 120 mg via INTRAVENOUS

## 2020-07-11 MED ORDER — KETOROLAC TROMETHAMINE 30 MG/ML IJ SOLN
INTRAMUSCULAR | Status: AC
  Filled 2020-07-11: qty 1

## 2020-07-11 MED ORDER — FENTANYL CITRATE (PF) 100 MCG/2ML IJ SOLN
INTRAMUSCULAR | Status: AC
  Filled 2020-07-11: qty 2

## 2020-07-11 MED ORDER — PROPOFOL 10 MG/ML IV BOLUS
INTRAVENOUS | Status: AC
  Filled 2020-07-11: qty 40

## 2020-07-11 MED ORDER — CALCIUM CARBONATE-VITAMIN D 500-200 MG-UNIT PO TABS
1.0000 | ORAL_TABLET | Freq: Every day | ORAL | Status: DC
  Administered 2020-07-12: 1 via ORAL
  Filled 2020-07-11: qty 1

## 2020-07-11 MED ORDER — MIDAZOLAM HCL 2 MG/2ML IJ SOLN
INTRAMUSCULAR | Status: AC
  Filled 2020-07-11: qty 2

## 2020-07-11 MED ORDER — ONDANSETRON HCL 4 MG/2ML IJ SOLN
INTRAMUSCULAR | Status: DC | PRN
  Administered 2020-07-11: 4 mg via INTRAVENOUS

## 2020-07-11 MED ORDER — MIDAZOLAM HCL 5 MG/5ML IJ SOLN
INTRAMUSCULAR | Status: DC | PRN
  Administered 2020-07-11: 2 mg via INTRAVENOUS

## 2020-07-11 MED ORDER — PHENYLEPHRINE HCL-NACL 10-0.9 MG/250ML-% IV SOLN
INTRAVENOUS | Status: DC | PRN
Start: 2020-07-11 — End: 2020-07-11
  Administered 2020-07-11: 10 ug/min via INTRAVENOUS

## 2020-07-11 MED ORDER — FENTANYL CITRATE (PF) 250 MCG/5ML IJ SOLN
INTRAMUSCULAR | Status: AC
  Filled 2020-07-11: qty 5

## 2020-07-11 MED ORDER — SODIUM CHLORIDE 0.9 % IV SOLN
INTRAVENOUS | Status: DC | PRN
  Administered 2020-07-11: 500 mL

## 2020-07-11 MED ORDER — DEXAMETHASONE SODIUM PHOSPHATE 4 MG/ML IJ SOLN
4.0000 mg | Freq: Four times a day (QID) | INTRAMUSCULAR | Status: DC
  Administered 2020-07-11 (×2): 4 mg via INTRAVENOUS
  Filled 2020-07-11 (×2): qty 1

## 2020-07-11 MED ORDER — POTASSIUM CHLORIDE IN NACL 20-0.9 MEQ/L-% IV SOLN: INTRAVENOUS | Status: DC

## 2020-07-11 MED ORDER — ACETAMINOPHEN 325 MG PO TABS: 650.0000 mg | ORAL_TABLET | ORAL | Status: DC | PRN

## 2020-07-11 MED ORDER — ONDANSETRON HCL 4 MG/2ML IJ SOLN
INTRAMUSCULAR | Status: AC
  Filled 2020-07-11: qty 2

## 2020-07-11 MED ORDER — AMLODIPINE BESYLATE 5 MG PO TABS
10.0000 mg | ORAL_TABLET | Freq: Every day | ORAL | Status: DC
  Administered 2020-07-12: 10 mg via ORAL
  Filled 2020-07-11: qty 2

## 2020-07-11 MED ORDER — FENTANYL CITRATE (PF) 100 MCG/2ML IJ SOLN
INTRAMUSCULAR | Status: DC | PRN
  Administered 2020-07-11: 50 ug via INTRAVENOUS
  Administered 2020-07-11 (×2): 75 ug via INTRAVENOUS

## 2020-07-11 MED ORDER — DEXAMETHASONE SODIUM PHOSPHATE 10 MG/ML IJ SOLN
INTRAMUSCULAR | Status: AC
  Filled 2020-07-11: qty 1

## 2020-07-11 MED ORDER — BUPIVACAINE HCL (PF) 0.25 % IJ SOLN
INTRAMUSCULAR | Status: DC | PRN
  Administered 2020-07-11: 18 mL

## 2020-07-11 MED ORDER — PHENOL 1.4 % MT LIQD: 1.0000 | OROMUCOSAL | Status: DC | PRN

## 2020-07-11 MED ORDER — PHENYLEPHRINE HCL (PRESSORS) 10 MG/ML IV SOLN
INTRAVENOUS | Status: AC
  Filled 2020-07-11: qty 1

## 2020-07-11 MED ORDER — THROMBIN 5000 UNITS EX SOLR
OROMUCOSAL | Status: DC | PRN
  Administered 2020-07-11: 5 mL

## 2020-07-11 MED ORDER — ROCURONIUM BROMIDE 10 MG/ML (PF) SYRINGE
PREFILLED_SYRINGE | INTRAVENOUS | Status: AC
  Filled 2020-07-11: qty 10

## 2020-07-11 MED ORDER — THROMBIN 20000 UNITS EX SOLR
CUTANEOUS | Status: AC
  Filled 2020-07-11: qty 20000

## 2020-07-11 MED ORDER — HEPARIN SODIUM (PORCINE) 1000 UNIT/ML IJ SOLN
INTRAMUSCULAR | Status: AC
  Filled 2020-07-11: qty 1

## 2020-07-11 MED ORDER — PROPOFOL 10 MG/ML IV BOLUS
INTRAVENOUS | Status: DC | PRN
  Administered 2020-07-11: 130 mg via INTRAVENOUS

## 2020-07-11 SURGICAL SUPPLY — 59 items
BAG DECANTER FOR FLEXI CONT (MISCELLANEOUS) ×3 IMPLANT
BENZOIN TINCTURE PRP APPL 2/3 (GAUZE/BANDAGES/DRESSINGS) ×3 IMPLANT
BLADE CLIPPER SURG (BLADE) IMPLANT
BUR CARBIDE MATCH 3.0 (BURR) ×3 IMPLANT
CANISTER SUCT 3000ML PPV (MISCELLANEOUS) ×3 IMPLANT
CARTRIDGE OIL MAESTRO DRILL (MISCELLANEOUS) ×1 IMPLANT
CLOSURE WOUND 1/2 X4 (GAUZE/BANDAGES/DRESSINGS) ×1
CNTNR URN SCR LID CUP LEK RST (MISCELLANEOUS) ×1 IMPLANT
CONT SPEC 4OZ STRL OR WHT (MISCELLANEOUS) ×2
COVER BACK TABLE 60X90IN (DRAPES) ×3 IMPLANT
COVER WAND RF STERILE (DRAPES) ×3 IMPLANT
DERMABOND ADVANCED (GAUZE/BANDAGES/DRESSINGS) ×2
DERMABOND ADVANCED .7 DNX12 (GAUZE/BANDAGES/DRESSINGS) ×1 IMPLANT
DIFFUSER DRILL AIR PNEUMATIC (MISCELLANEOUS) ×3 IMPLANT
DRAPE C-ARM 42X72 X-RAY (DRAPES) ×6 IMPLANT
DRAPE LAPAROTOMY 100X72X124 (DRAPES) ×3 IMPLANT
DRAPE SURG 17X23 STRL (DRAPES) ×3 IMPLANT
DRSG OPSITE POSTOP 4X6 (GAUZE/BANDAGES/DRESSINGS) ×3 IMPLANT
DURAPREP 26ML APPLICATOR (WOUND CARE) ×3 IMPLANT
ELECT REM PT RETURN 9FT ADLT (ELECTROSURGICAL) ×3
ELECTRODE REM PT RTRN 9FT ADLT (ELECTROSURGICAL) ×1 IMPLANT
EVACUATOR 1/8 PVC DRAIN (DRAIN) ×3 IMPLANT
GAUZE 4X4 16PLY RFD (DISPOSABLE) IMPLANT
GLOVE BIO SURGEON STRL SZ7 (GLOVE) IMPLANT
GLOVE BIO SURGEON STRL SZ8 (GLOVE) ×6 IMPLANT
GLOVE BIOGEL PI IND STRL 7.0 (GLOVE) IMPLANT
GLOVE BIOGEL PI INDICATOR 7.0 (GLOVE)
GOWN STRL REUS W/ TWL LRG LVL3 (GOWN DISPOSABLE) IMPLANT
GOWN STRL REUS W/ TWL XL LVL3 (GOWN DISPOSABLE) ×2 IMPLANT
GOWN STRL REUS W/TWL 2XL LVL3 (GOWN DISPOSABLE) IMPLANT
GOWN STRL REUS W/TWL LRG LVL3 (GOWN DISPOSABLE)
GOWN STRL REUS W/TWL XL LVL3 (GOWN DISPOSABLE) ×4
HEMOSTAT POWDER KIT SURGIFOAM (HEMOSTASIS) ×3 IMPLANT
KIT BASIN OR (CUSTOM PROCEDURE TRAY) ×3 IMPLANT
KIT BONE MRW ASP ANGEL CPRP (KITS) ×3 IMPLANT
KIT TURNOVER KIT B (KITS) ×3 IMPLANT
NEEDLE HYPO 25X1 1.5 SAFETY (NEEDLE) ×3 IMPLANT
NS IRRIG 1000ML POUR BTL (IV SOLUTION) ×3 IMPLANT
OIL CARTRIDGE MAESTRO DRILL (MISCELLANEOUS) ×3
PACK LAMINECTOMY NEURO (CUSTOM PROCEDURE TRAY) ×3 IMPLANT
PAD ARMBOARD 7.5X6 YLW CONV (MISCELLANEOUS) ×9 IMPLANT
PUTTY DBM ALLOSYNC PURE 10CC (Putty) ×3 IMPLANT
ROD LORD LIPPED TI 5.5X35 (Rod) ×6 IMPLANT
SCREW CORT SHANK MOD 6.5X40 (Screw) ×12 IMPLANT
SCREW POLYAXIAL TULIP (Screw) ×12 IMPLANT
SET SCREW (Screw) ×8 IMPLANT
SET SCREW SPNE (Screw) ×4 IMPLANT
SPACER IDENTITI PS 11X9X25 10D (Spacer) ×6 IMPLANT
SPONGE LAP 4X18 RFD (DISPOSABLE) IMPLANT
SPONGE SURGIFOAM ABS GEL 100 (HEMOSTASIS) ×3 IMPLANT
STRIP CLOSURE SKIN 1/2X4 (GAUZE/BANDAGES/DRESSINGS) ×2 IMPLANT
SUT VIC AB 0 CT1 18XCR BRD8 (SUTURE) ×1 IMPLANT
SUT VIC AB 0 CT1 8-18 (SUTURE) ×2
SUT VIC AB 2-0 CP2 18 (SUTURE) ×3 IMPLANT
SUT VIC AB 3-0 SH 8-18 (SUTURE) ×6 IMPLANT
TOWEL GREEN STERILE (TOWEL DISPOSABLE) ×3 IMPLANT
TOWEL GREEN STERILE FF (TOWEL DISPOSABLE) ×3 IMPLANT
TRAY FOLEY MTR SLVR 16FR STAT (SET/KITS/TRAYS/PACK) ×3 IMPLANT
WATER STERILE IRR 1000ML POUR (IV SOLUTION) ×3 IMPLANT

## 2020-07-11 NOTE — Op Note (Signed)
07/11/2020  9:55 AM  PATIENT:  Kristine Brooks  69 y.o. 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 Decompressive lumbar laminectomy, hemifacetectomy and foraminotomy L4-5 with resection of synovial cyst requiring more work than would be required for a simple exposure of the disk for PLIF in order to adequately decompress the neural elements and address the spinal stenosis 2. Posterior lumbar interbody fusion L4-5 using porous titanium interbody cages packed with morcellized allograft and autograft soaked with a bone marrow aspirate obtained through a separate fascial incision over the right iliac crest 3. Posterior fixation L4-5 using Alphatec cortical pedicle screws.  4. Intertransverse arthrodesis L4-5 using morcellized autograft and allograft.  SURGEON:  Marikay Alar, MD  ASSISTANTS: Verlin Dike, FNP  ANESTHESIA:  General  EBL: 1225 ml  Total I/O In: 1100 [I.V.:1000; IV Piggyback:100] Out: 250 [Urine:125; Blood:125]  BLOOD ADMINISTERED:none  DRAINS: none   INDICATION FOR PROCEDURE: This patient presented with severe back and left leg pain. Imaging revealed severe stenosis at L4-5 with spondylolisthesis. The patient tried a reasonable attempt at conservative medical measures without relief. I recommended decompression and instrumented fusion to address the stenosis as well as the segmental  instability.  Patient understood the risks, benefits, and alternatives and potential outcomes and wished to proceed.  PROCEDURE DETAILS:  The patient was brought to the operating room. After induction of generalized endotracheal anesthesia the patient was rolled into the prone position on chest rolls and all pressure points were padded. The patient's lumbar region was cleaned and then prepped with DuraPrep and draped in the usual sterile fashion. Anesthesia was  injected and then a dorsal midline incision was made and carried down to the lumbosacral fascia. The fascia was opened and the paraspinous musculature was taken down in a subperiosteal fashion to expose L4-5. A self-retaining retractor was placed. Intraoperative fluoroscopy confirmed my level, and I started with placement of the L4 cortical pedicle screws. The pedicle screw entry zones were identified utilizing surface landmarks and  AP and lateral fluoroscopy. I scored the cortex with the high-speed drill and then used the hand drill to drill an upward and outward direction into the pedicle. I then tapped line to line. I then placed a 6.5 x 40 mm cortical pedicle screw into the pedicles of L4 bilaterally.  I then dissected in a suprafascial plane to expose the iliac crest.  Opened the fascia and we used a Jamshidi needle to extract 60 cc of bone marrow aspirate from the iliac crest with the assistance of my nurse practitioner.  This was then spun down by [facility] device and 2 to 4 cc of  BMAC was soaked on morselized allograft for later arthrodesis.  I dried the hole with Surgifoam and closed the fascia.  I then turned my attention to the decompression and complete lumbar laminectomies, hemi- facetectomies, and foraminotomies were performed at L4-5.  My nurse practitioner was directly involved in the decompression and exposure of the neural elements. the patient had significant spinal stenosis and this required more work than would be required for a simple exposure of the disc for posterior lumbar interbody fusion which would only require a limited laminotomy. Much more generous decompression and generous foraminotomy was undertaken in order to adequately decompress the neural elements and address the patient's leg pain. The yellow ligament was removed to expose the underlying dura and nerve roots, and generous foraminotomies were  performed to adequately decompress the neural elements. Both the exiting and traversing  nerve roots were decompressed on both sides until a coronary dilator passed easily along the nerve roots.  The right-hand side there was a large synovial cyst causing significant compression of the L5 nerve root.  This was dissected away from the dura and removed en bloc.  Once the decompression was complete, I turned my attention to the posterior lower lumbar interbody fusion. The epidural venous vasculature was coagulated and cut sharply. Disc space was incised and the initial discectomy was performed with pituitary rongeurs. The disc space was distracted with sequential distractors to a height of 11 mm. We then used a series of scrapers and shavers to prepare the endplates for fusion. The midline was prepared with Epstein curettes. Once the complete discectomy was finished, we packed an appropriate sized interbody cage with local autograft and morcellized allograft, gently retracted the nerve root, and tapped the cage into position at L4-5.  The midline between the cages was packed with morselized autograft and allograft. We then turned our attention to the placement of the lower pedicle screws. The pedicle screw entry zones were identified utilizing surface landmarks and fluoroscopy. I drilled into each pedicle utilizing the hand drill, and tapped each pedicle with the appropriate tap. We palpated with a ball probe to assure no break in the cortex. We then placed 6.5 x 40 mm cortical pedicle screws into the pedicles bilaterally at L5.  My nurse practitioner assisted in placement of the pedicle screws.  We then decorticated the transverse processes and laid a mixture of morcellized autograft and allograft out over these to perform intertransverse arthrodesis at L4-5. We then placed lordotic rods into the multiaxial screw heads of the pedicle screws and locked these in position with the locking caps and anti-torque device. We then checked our construct with AP and lateral fluoroscopy. Irrigated with copious  amounts of bacitracin-containing saline solution. Inspected the nerve roots once again to assure adequate decompression, lined to the dura with Gelfoam, placed powdered vancomycin into the wound, and then we closed the muscle and the fascia with 0 Vicryl. Closed the subcutaneous tissues with 2-0 Vicryl and subcuticular tissues with 3-0 Vicryl. The skin was closed with benzoin and Steri-Strips. Dressing was then applied, the patient was awakened from general anesthesia and transported to the recovery room in stable condition. At the end of the procedure all sponge, needle and instrument counts were correct.   PLAN OF CARE: admit to inpatient  PATIENT DISPOSITION:  PACU - hemodynamically stable.   Delay start of Pharmacological VTE agent (>24hrs) due to surgical blood loss or risk of bleeding:  yes

## 2020-07-11 NOTE — Anesthesia Postprocedure Evaluation (Signed)
Anesthesia Post Note  Patient: Kristine Brooks  Procedure(s) Performed: Posterior Lumbar Interbody Fusion - Lumbar Four-Lumbar Five (N/A Back)     Patient location during evaluation: PACU Anesthesia Type: General Level of consciousness: awake and alert, oriented and awake Pain management: pain level controlled Vital Signs Assessment: post-procedure vital signs reviewed and stable Respiratory status: spontaneous breathing, nonlabored ventilation, respiratory function stable and patient connected to nasal cannula oxygen Cardiovascular status: blood pressure returned to baseline and stable Postop Assessment: no apparent nausea or vomiting Anesthetic complications: no   No complications documented.  Last Vitals:  Vitals:   07/11/20 1144 07/11/20 1620  BP: 130/64 121/75  Pulse: (!) 59 79  Resp: 20 18  Temp: 36.7 C 37.2 C  SpO2: 100% 99%    Last Pain:  Vitals:   07/11/20 1650  TempSrc:   PainSc: 8                  Cecile Hearing

## 2020-07-11 NOTE — Evaluation (Signed)
Physical Therapy Evaluation Patient Details Name: Kristine Brooks MRN: [id number] DOB: Jun 04, 1951 Today's Date: 07/11/2020   History of Present Illness  Patient is a 69 y/o female who presents s/p L4-5 PLIF 07/11/20. PMh includes osteoporosis, HTN.  Clinical Impression  Patient presents with post surgical deficits s/p above surgery. Pt Mod I with SPC and ADLs PTA; lives with spouse. Today, pt tolerated bed mobility, transfers and gait training with supervision-Mod I for safety. Tolerated stair training with supervision and use of rails. Education re: back precautions, handout, log roll technique, positioning, car transfer, walking program, brace etc. Pt functioning at supervision-Mod I level and will have necessary support at home and therefore does not require further acute skilled therapy services. All education completed. Discharge from therapy.    Follow Up Recommendations No PT follow up;Supervision - Intermittent    Equipment Recommendations  None recommended by PT    Recommendations for Other Services       Precautions / Restrictions Precautions Precautions: Back Precaution Booklet Issued: Yes (comment) Precaution Comments: Reviewed handout and precautions Required Braces or Orthoses: Spinal Brace Spinal Brace: Lumbar corset;Applied in sitting position Restrictions Weight Bearing Restrictions: No      Mobility  Bed Mobility Overal bed mobility: Modified Independent             General bed mobility comments: Cues for log roll technique, HOB elevated (has a sleeper number bed at home) and no rails to simulate home.  Transfers Overall transfer level: Needs assistance Equipment used: None Transfers: Sit to/from Stand Sit to Stand: Supervision         General transfer comment: Supervision for safety. Stood from [employer].  Ambulation/Gait Ambulation/Gait assistance: Modified independent (Device/Increase time) Gait Distance (Feet): 400 Feet Assistive device:  None Gait Pattern/deviations: Step-through pattern;Decreased stride length   Gait velocity interpretation: 1.31 - 2.62 ft/sec, indicative of limited community ambulator General Gait Details: Slow, steady gait, no evidence of imbalance.  Stairs Stairs: Yes Stairs assistance: Supervision Stair Management: Step to pattern;One rail Left Number of Stairs: 3 General stair comments: Cues for technique and safety.  Wheelchair Mobility    Modified Rankin (Stroke Patients Only)       Balance Overall balance assessment: No apparent balance deficits (not formally assessed)                                           Pertinent Vitals/Pain Pain Assessment: No/denies pain    Home Living Family/patient expects to be discharged to:: Private residence Living Arrangements: Spouse/significant other Available Help at Discharge: Family;Available 24 hours/day Type of Home: House Home Access: Stairs to enter Entrance Stairs-Rails: Left Entrance Stairs-Number of Steps: 3 Home Layout: One level Home Equipment: Cane - single point;Shower seat;Bedside commode Additional Comments: gripper    Prior Function Level of Independence: Independent with assistive device(s)         Comments: Using SPC the last few weeks due to pain in LLE. independent for ADLs.     Hand Dominance   Dominant Hand: Left    Extremity/Trunk Assessment   Upper Extremity Assessment Upper Extremity Assessment: Defer to OT evaluation    Lower Extremity Assessment Lower Extremity Assessment: Overall WFL for tasks assessed (Sensation WFLs)    Cervical / Trunk Assessment Cervical / Trunk Assessment: Other exceptions Cervical / Trunk Exceptions: s/p spine surgery  Communication   Communication: No difficulties  Cognition Arousal/Alertness: Awake/alert  Behavior During Therapy: WFL for tasks assessed/performed Overall Cognitive Status: Within Functional Limits for tasks assessed                                         General Comments General comments (skin integrity, edema, etc.): Spouse present during session. Able to donn/doff brace sitting EOB without difficulty.    Exercises     Assessment/Plan    PT Assessment Patent does not need any further PT services  PT Problem List         PT Treatment Interventions      PT Goals (Current goals can be found in the Care Plan section)  Acute Rehab PT Goals Patient Stated Goal: to take a trip and see family PT Goal Formulation: All assessment and education complete, DC therapy    Frequency     Barriers to discharge        Co-evaluation               AM-PAC PT "6 Clicks" Mobility  Outcome Measure Help needed turning from your back to your side while in a flat bed without using bedrails?: None Help needed moving from lying on your back to sitting on the side of a flat bed without using bedrails?: None Help needed moving to and from a bed to a chair (including a wheelchair)?: None Help needed standing up from a chair using your arms (e.g., wheelchair or bedside chair)?: None Help needed to walk in hospital room?: None Help needed climbing 3-5 steps with a railing? : None 6 Click Score: 24    End of Session Equipment Utilized During Treatment: Back brace Activity Tolerance: Patient tolerated treatment well Patient left: in bed;with call bell/phone within reach;with SCD's reapplied;with family/visitor present Nurse Communication: Mobility status PT Visit Diagnosis: Difficulty in walking, not elsewhere classified (R26.2)    Time: 1458-1530 PT Time Calculation (min) (ACUTE ONLY): 32 min   Charges:   PT Evaluation $PT Eval Low Complexity: 1 Low PT Treatments $Gait Training: 8-22 mins        Vale Haven, PT, DPT Acute Rehabilitation Services Pager [phone] Office [phone]      Blake Divine A Lanier Ensign 07/11/2020, 3:38 PM

## 2020-07-11 NOTE — Anesthesia Procedure Notes (Addendum)
Procedure Name: MAC Date/Time: 07/11/2020 7:37 AM Performed by: Renato Shin, CRNA Pre-anesthesia Checklist: Patient identified, Emergency Drugs available, Suction available and Patient being monitored Patient Re-evaluated:Patient Re-evaluated prior to induction Oxygen Delivery Method: Circle system utilized Preoxygenation: Pre-oxygenation with 100% oxygen Induction Type: IV induction Ventilation: Mask ventilation without difficulty Laryngoscope Size: Mac and 3 Grade View: Grade I Number of attempts: 1 Airway Equipment and Method: Stylet Placement Confirmation: ETT inserted through vocal cords under direct vision,  positive ETCO2 and breath sounds checked- equal and bilateral Secured at: 21 cm Tube secured with: Tape Dental Injury: Teeth and Oropharynx as per pre-operative assessment  Comments: AOI by Pierce Crane., SRNA under direct supervision.

## 2020-07-11 NOTE — Transfer of Care (Signed)
Immediate Anesthesia Transfer of Care Note  Patient: Kristine Brooks  Procedure(s) Performed: Posterior Lumbar Interbody Fusion - Lumbar Four-Lumbar Five (N/A Back)  Patient Location: PACU  Anesthesia Type:General  Level of Consciousness: awake, alert , oriented and patient cooperative  Airway & Oxygen Therapy: Patient Spontanous Breathing and Patient connected to face mask oxygen  Post-op Assessment: Report given to RN, Post -op Vital signs reviewed and stable and Patient moving all extremities X 4  Post vital signs: Reviewed and stable  Last Vitals:  Vitals Value Taken Time  BP 103/55 07/11/20 0957  Temp    Pulse 57 07/11/20 1000  Resp 10 07/11/20 1000  SpO2 98 % 07/11/20 1000  Vitals shown include unvalidated device data.  Last Pain:  Vitals:   07/11/20 0636  TempSrc:   PainSc: 1       Patients Stated Pain Goal: 3 (07/11/20 0636)  Complications: No complications documented.

## 2020-07-11 NOTE — H&P (Signed)
Subjective: Patient is a 69 y.o. female admitted for plif. Onset of symptoms was several months ago, gradually worsening since that time.  The pain is rated severe, unremitting, and is located at the across the lower back and radiates to LLE. The pain is described as aching and occurs all day. The symptoms have been progressive. Symptoms are exacerbated by exercise. MRI or CT showed spondylolisthesis L4-5   Past Medical History:  Diagnosis Date  . 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 Asthma    per patient hx of gastric asthma  . Bronchitis   . GERD (gastroesophageal reflux disease)   . Heart murmur    per patient benign murmur that has been here all her life  . Hypertension   . Kidney infection    per patient at the age of 59  . Osteoporosis   . Pneumonia     Past Surgical History:  Procedure Laterality Date  . BREAST BIOPSY Right 2016  . TONSILLECTOMY    . TUBAL LIGATION     per patient 1984 or 1985    Prior to Admission medications   Medication Sig Start Date End Date Taking? Authorizing Provider  adapalene (DIFFERIN) 0.1 % gel Apply 1 application topically at bedtime.   Yes [provider]  aliskiren (TEKTURNA) 300 MG tablet Take 300 mg by mouth daily.   Yes [provider]  amLODipine (NORVASC) 10 MG tablet Take 10 mg by mouth daily.   Yes [provider]  Calcium Carbonate-Vitamin D3 (CALCIUM 600-D) 600-400 MG-UNIT TABS Take 1 tablet by mouth daily.   Yes [provider]  Cholecalciferol (VITAMIN D) 2000 UNITS tablet Take 2,000 Units by mouth daily.   Yes [provider]  denosumab (PROLIA) 60 MG/ML SOSY injection Inject 60 mg into the skin every 6 (six) months.   Yes [provider]  fexofenadine (ALLEGRA) 180 MG tablet Take 180 mg by mouth daily.   Yes [provider]  fluticasone (FLONASE) 50 MCG/ACT nasal spray Place 2 sprays into both nostrils daily.   Yes [provider]  GLUCOSAMINE SULFATE  PO Take 2 tablets by mouth daily. InvigoFlex GS Glucosamine Sulfate   Yes [provider]  HYDROcodone-acetaminophen (NORCO/VICODIN) 5-325 MG tablet Take 1 tablet by mouth every 6 (six) hours as needed (back pain.).    Yes [provider]  metroNIDAZOLE (METROCREAM) 0.75 % cream Apply 1 application topically 2 (two) times daily. Rosacea   Yes [provider]  Multiple Vitamin (MULTIVITAMIN WITH MINERALS) TABS tablet Take 1 tablet by mouth daily. Centrum Silver   Yes [provider]  Probiotic Product (PROBIOTIC PO) Take 1 capsule by mouth daily. Ultimate Flora   Yes [provider]  psyllium (METAMUCIL) 0.52 g capsule Take 3.12 g by mouth daily.   Yes [provider]  rosuvastatin (CRESTOR) 5 MG tablet Take 5 mg by mouth at bedtime.   Yes [provider]   Allergies  Allergen Reactions  . Ciprofloxacin Other (See Comments)    Mouth ulcers   . Tetracyclines & Related Other (See Comments)    Mouth ulcers  . Diovan [Valsartan] Cough  . Ace Inhibitors Cough  . Angiotensin Receptor Blockers Cough  . Lotrel [Amlodipine Besy-Benazepril Hcl] Cough  . Sulfa Antibiotics Nausea Only    Social History   Tobacco Use  . Smoking status: Never Smoker  . Smokeless tobacco: Never Used  Substance Use Topics  . Alcohol use: Yes  Comment: occasionally    Family History  Problem Relation Age of Onset  . Breast cancer Neg Hx      Review of Systems  Positive ROS: neg  All other systems have been reviewed and were otherwise negative with the exception of those mentioned in the HPI and as above.  Objective: Vital signs in last 24 hours: Temp:  [97.7 F (36.5 C)] 97.7 F (36.5 C) (08/09 0610) Pulse Rate:  [65] 65 (08/09 0610) Resp:  [17] 17 (08/09 0610) BP: (125)/(66) 125/66 (08/09 0610) SpO2:  [100 %] 100 % (08/09 0610) Weight:  [59 kg] 59 kg (08/09 0610)  General Appearance: Alert, cooperative, no distress, appears stated  age Head: Normocephalic, without obvious abnormality, atraumatic Eyes: PERRL, conjunctiva/corneas clear, EOM's intact    Neck: Supple, symmetrical, trachea midline Back: Symmetric, no curvature, ROM normal, no CVA tenderness Lungs:  respirations unlabored Heart: Regular rate and rhythm Abdomen: Soft, non-tender Extremities: Extremities normal, atraumatic, no cyanosis or edema Pulses: 2+ and symmetric all extremities Skin: Skin color, texture, turgor normal, no rashes or lesions  NEUROLOGIC:   Mental status: Alert and oriented x4,  no aphasia, good attention span, fund of knowledge, and memory Motor Exam - grossly normal Sensory Exam - grossly normal Reflexes: 1+ Coordination - grossly normal Gait - grossly normal Balance - grossly normal Cranial Nerves: I: smell Not tested  II: visual acuity  OS: nl    OD: nl  II: visual fields Full to confrontation  II: pupils Equal, round, reactive to light  III,VII: ptosis None  III,IV,VI: extraocular muscles  Full ROM  V: mastication Normal  V: facial light touch sensation  Normal  V,VII: corneal reflex  Present  VII: facial muscle function - upper  Normal  VII: facial muscle function - lower Normal  VIII: hearing Not tested  IX: soft palate elevation  Normal  IX,X: gag reflex Present  XI: trapezius strength  5/5  XI: sternocleidomastoid strength 5/5  XI: neck flexion strength  5/5  XII: tongue strength  Normal    Data Review Lab Results  Component Value Date   WBC 5.0 07/04/2020   HGB 13.1 07/04/2020   HCT 40.6 07/04/2020   MCV 88.8 07/04/2020   PLT 287 07/04/2020   Lab Results  Component Value Date   NA 137 07/04/2020   K 4.0 07/04/2020   CL 102 07/04/2020   CO2 26 07/04/2020   BUN 9 07/04/2020   CREATININE 0.59 07/04/2020   GLUCOSE 91 07/04/2020   Lab Results  Component Value Date   INR 1.0 07/04/2020    Assessment/Plan:  Estimated body mass index is 23.03 kg/m as calculated from the following:   Height as  of this encounter: 5\' 3"  (1.6 m).   Weight as of this encounter: 59 kg. Patient admitted for PLIF L4-5. Patient has failed a reasonable attempt at conservative therapy.  I explained the condition and procedure to the patient and answered any questions.  Patient wishes to proceed with procedure as planned. Understands risks/ benefits and typical outcomes of procedure.   07/11/2020 7:12 AM

## 2020-07-12 DIAGNOSIS — M48061 Spinal stenosis, lumbar region without neurogenic claudication: Secondary | ICD-10-CM | POA: Diagnosis not present

## 2020-07-12 MED ORDER — METHOCARBAMOL 500 MG PO TABS: 500.0000 mg | ORAL_TABLET | Freq: Four times a day (QID) | ORAL | 0 refills | Status: DC | PRN

## 2020-07-12 NOTE — Discharge Instructions (Signed)

## 2020-07-12 NOTE — Discharge Summary (Signed)
Physician Discharge Summary  Patient ID: Kristine Brooks MRN: [id number] DOB/AGE: [birth year] 69 y.o.  Admit date: 07/11/2020 Discharge date: 07/12/2020  Admission Diagnoses: Spinal stenosis L4-5, right L4-5 synovial cyst, spondylolisthesis L4-5, back and leg pain    Discharge Diagnoses: same   Discharged Condition: good  Hospital Course: The patient was admitted on 07/11/2020 and taken to the operating room where the patient underwent PLIF L4-5. The patient tolerated the procedure well and was taken to the recovery room and then to the floor in stable condition. The hospital course was routine. There were no complications. The wound remained clean dry and intact. Pt had appropriate back soreness. No complaints of leg pain or new N/T/W. The patient remained afebrile with stable vital signs, and tolerated a regular diet. The patient continued to increase activities, and pain was well controlled with oral pain medications.   Consults: None  Significant Diagnostic Studies:  Results for orders placed or performed during the hospital encounter of 07/11/20  ABO/Rh  Result Value Ref Range   ABO/RH(D)      O POS Performed at [facility], [address] [place], [place] [address]     Chest 2 View  Result Date: 07/04/2020 CLINICAL DATA:  Preoperative radiograph for PLIF schedule 07/11/2020 EXAM: CHEST - 2 VIEW COMPARISON:  None FINDINGS: No consolidation, features of edema, pneumothorax, or effusion. Pulmonary vascularity is normally distributed. The cardiomediastinal contours are unremarkable. No acute osseous or soft tissue abnormality. IMPRESSION: No acute cardiopulmonary abnormality. Electronically Signed   By: Kreg Shropshire M.D.   On: 07/04/2020 16:34   DG Lumbar Spine 2-3 Views  Result Date: 07/11/2020 CLINICAL DATA:  Posterior fusion EXAM: LUMBAR SPINE - 2-3 VIEW COMPARISON:  Lumbar CT postmyelogram May 18, 2020; lumbar myelogram May 18, 2020 FLUOROSCOPY TIME:  1 minutes 9 seconds;  38.52 mGy; 2 acquired images FINDINGS: Frontal and lateral views were obtained. The patient is status post screw and plate fixation from a posterior approach at L4 and L5 with disc spacer at L4-5. Support hardware intact. No evident fracture. There is again noted 3 mm of anterolisthesis of L4 on L5. No other spondylolisthesis. Visualized disc spaces appear unremarkable. IMPRESSION: Status post posterior screw and plate fixation at L4 and L5 with disc spacer at L4-5. Support hardware intact. No fracture. Slight spondylolisthesis at L4-5 again noted. Visualized disc spaces intact. Electronically Signed   By: Bretta Bang III M.D.   On: 07/11/2020 09:50   DG C-Arm 1-60 Min  Result Date: 07/11/2020 CLINICAL DATA:  Status post L4-5 PLIF. EXAM: DG C-ARM 1-60 MIN CONTRAST:  None FLUOROSCOPY TIME:  Fluoroscopy Time:  1 minutes 9 seconds Radiation Exposure Index (if provided by the fluoroscopic device): 38.52 mGy Number of Acquired Spot Images: 0 COMPARISON:  Lumbar spine CT from 05/18/2020 FINDINGS: Postoperative changes from bilateral pedicle screw and posterior rod placement at L4-5. Interbody prosthetic disc identified within the L4-5 disc space. Hardware components are in anatomic alignment. No complications IMPRESSION: 1. Status post PLIF of L4-5. Electronically Signed   By: Signa Kell M.D.   On: 07/11/2020 09:51   MM 3D SCREEN BREAST BILATERAL  Result Date: 06/22/2020 CLINICAL DATA:  Screening. EXAM: DIGITAL SCREENING BILATERAL MAMMOGRAM WITH TOMO AND CAD COMPARISON:  Previous exam(s). ACR Breast Density Category b: There are scattered areas of fibroglandular density. FINDINGS: There are no findings suspicious for malignancy. Images were processed with CAD. IMPRESSION: No mammographic evidence of malignancy. A result letter of this screening mammogram will be mailed  directly to the patient. RECOMMENDATION: Screening mammogram in one year. (Code:SM-B-01Y) BI-RADS CATEGORY  1: Negative. Electronically  Signed   By: Sande Brothers M.D.   On: 06/22/2020 10:35    Antibiotics:  Anti-infectives (From admission, onward)   Start     Dose/Rate Route Frequency Ordered Stop   07/11/20 1600  ceFAZolin (ANCEF) IVPB 2g/100 mL premix        2 g 200 mL/hr over 30 Minutes Intravenous Every 8 hours 07/11/20 1137 07/12/20 0800   07/11/20 0804  bacitracin 50,000 Units in sodium chloride 0.9 % 500 mL irrigation  Status:  Discontinued          As needed 07/11/20 0804 07/11/20 1004   07/11/20 0615  ceFAZolin (ANCEF) IVPB 2g/100 mL premix        2 g 200 mL/hr over 30 Minutes Intravenous On call to O.R. 07/11/20 0600 07/11/20 0820      Discharge Exam: Blood pressure 139/67, pulse 69, temperature 98.2 F (36.8 C), temperature source Oral, resp. rate 18, height 5\' 3"  (1.6 m), weight 59 kg, SpO2 98 %. Neurologic: Grossly normal Ambulating and voiding well, incision cdi  Discharge Medications:   Allergies as of 07/12/2020      Reactions   Ciprofloxacin Other (See Comments)   Mouth ulcers   Tetracyclines & Related Other (See Comments)   Mouth ulcers   Diovan [valsartan] Cough   Ace Inhibitors Cough   Angiotensin Receptor Blockers Cough   Lotrel [amlodipine Besy-benazepril Hcl] Cough   Sulfa Antibiotics Nausea Only      Medication List    TAKE these medications   adapalene 0.1 % gel Commonly known as: DIFFERIN Apply 1 application topically at bedtime.   aliskiren 300 MG tablet Commonly known as: TEKTURNA Take 300 mg by mouth daily.   amLODipine 10 MG tablet Commonly known as: NORVASC Take 10 mg by mouth daily.   Calcium 600-D 600-400 MG-UNIT Tabs Generic drug: Calcium Carbonate-Vitamin D3 Take 1 tablet by mouth daily.   denosumab 60 MG/ML Sosy injection Commonly known as: PROLIA Inject 60 mg into the skin every 6 (six) months.   fexofenadine 180 MG tablet Commonly known as: ALLEGRA Take 180 mg by mouth daily.   fluticasone 50 MCG/ACT nasal spray Commonly known as: FLONASE Place 2  sprays into both nostrils daily.   GLUCOSAMINE SULFATE PO Take 2 tablets by mouth daily. InvigoFlex GS Glucosamine Sulfate   HYDROcodone-acetaminophen 5-325 MG tablet Commonly known as: NORCO/VICODIN Take 1 tablet by mouth every 6 (six) hours as needed (back pain.).   Metamucil 0.52 g capsule Generic drug: psyllium Take 3.12 g by mouth daily.   methocarbamol 500 MG tablet Commonly known as: ROBAXIN Take 1 tablet (500 mg total) by mouth every 6 (six) hours as needed for muscle spasms.   metroNIDAZOLE 0.75 % cream Commonly known as: METROCREAM Apply 1 application topically 2 (two) times daily. Rosacea   multivitamin with minerals Tabs tablet Take 1 tablet by mouth daily. Centrum Silver   PROBIOTIC PO Take 1 capsule by mouth daily. Ultimate Flora   rosuvastatin 5 MG tablet Commonly known as: CRESTOR Take 5 mg by mouth at bedtime.   Vitamin D 50 MCG (2000 UT) tablet Take 2,000 Units by mouth daily.            Durable Medical Equipment  (From admission, onward)         Start     Ordered   07/11/20 1138  DME Walker rolling  Once  Question:  Patient needs a walker to treat with the following condition  Answer:  S/P lumbar fusion   07/11/20 1137   07/11/20 1138  DME 3 n 1  Once        07/11/20 1137          Disposition: home   Final Dx: PLIf L4-5  Discharge Instructions     Remove dressing in 72 hours   Complete by: As directed    Call MD for:  difficulty breathing, headache or visual disturbances   Complete by: As directed    Call MD for:  hives   Complete by: As directed    Call MD for:  persistant nausea and vomiting   Complete by: As directed    Call MD for:  redness, tenderness, or signs of infection (pain, swelling, redness, odor or green/yellow discharge around incision site)   Complete by: As directed    Call MD for:  severe uncontrolled pain   Complete by: As directed    Call MD for:  temperature >100.4   Complete by: As directed    Diet  - low sodium heart healthy   Complete by: As directed    Driving Restrictions   Complete by: As directed    No driving for 2 weeks, no riding in the car for 1 week   Increase activity slowly   Complete by: As directed    Lifting restrictions   Complete by: As directed    No lifting more than 8 lbs         Signed: Tiana Loft Cori Justus 07/12/2020, 8:01 AM

## 2020-07-12 NOTE — Progress Notes (Signed)
Patient is discharged from room 3C08 at this time. Alert and in stable condition. IV site d/c'd and instructions read to patient and spouse with understanding verbalized and all questions answered. Left unit via wheelchair with all belongings at side. 

## 2020-07-12 NOTE — Evaluation (Signed)
Occupational Therapy Evaluation Patient Details Name: Kristine Brooks MRN: [id number] DOB: 03/25/1951 Today's Date: 07/12/2020    History of Present Illness Patient is a 69 y/o female who presents s/p L4-5 PLIF 07/11/20. PMh includes osteoporosis, HTN.   Clinical Impression   PTA patient was independent with BADL/IADLs with recent use of SPC 2/2 pain. Prior to that, patient was an independent ambulator in home and community dwellings. Patient currently presents near baseline level of function demonstrating Mod I for dressing, toileting, and grooming standing at sink level with adherence to back precautions. Patient does not require continued acute occupational therapy services with OT to sign off at this time.     Follow Up Recommendations  No OT follow up    Equipment Recommendations  None recommended by OT    Recommendations for Other Services       Precautions / Restrictions Precautions Precautions: Back Precaution Booklet Issued: Yes (comment) Precaution Comments: Reviewed handout and precautions Required Braces or Orthoses: Spinal Brace Spinal Brace: Lumbar corset;Applied in sitting position Restrictions Weight Bearing Restrictions: No      Mobility Bed Mobility Overal bed mobility: Modified Independent             General bed mobility comments: Patient demonstrates safety with log rolling technique without cues this date  Transfers Overall transfer level: Needs assistance Equipment used: None Transfers: Sit to/from Stand Sit to Stand: Modified independent (Device/Increase time)         General transfer comment: Mod I for sit <> stand from EOB with use of BUE on legs    Balance Overall balance assessment: No apparent balance deficits (not formally assessed)                                         ADL either performed or assessed with clinical judgement   ADL Overall ADL's : Modified independent                                              Vision Baseline Vision/History: Wears glasses Wears Glasses: At all times Vision Assessment?: No apparent visual deficits     Perception     Praxis      Pertinent Vitals/Pain Pain Assessment: No/denies pain     Hand Dominance Left   Extremity/Trunk Assessment Upper Extremity Assessment Upper Extremity Assessment: Overall WFL for tasks assessed   Lower Extremity Assessment Lower Extremity Assessment: Overall WFL for tasks assessed   Cervical / Trunk Assessment Cervical / Trunk Assessment: Other exceptions Cervical / Trunk Exceptions: s/p spine surgery   Communication Communication Communication: No difficulties   Cognition Arousal/Alertness: Awake/alert Behavior During Therapy: WFL for tasks assessed/performed Overall Cognitive Status: Within Functional Limits for tasks assessed                                     General Comments       Exercises     Shoulder Instructions      Home Living Family/patient expects to be discharged to:: Private residence Living Arrangements: Spouse/significant other Available Help at Discharge: Family;Available 24 hours/day Type of Home: House Home Access: Stairs to enter [employer] of Steps: 3 Entrance Stairs-Rails: Left Home Layout: One level  Bathroom Shower/Tub: Producer, television/film/video: Standard     Home Equipment: Cane - single point;Shower seat;Bedside commode   Additional Comments: gripper      Prior Functioning/Environment Level of Independence: Independent with assistive device(s)        Comments: Using SPC the last few weeks due to pain in LLE. independent for ADLs.        OT Problem List:        OT Treatment/Interventions:      OT Goals(Current goals can be found in the care plan section) Acute Rehab OT Goals Patient Stated Goal: To return home   OT Frequency:     Barriers to D/C:            Co-evaluation               AM-PAC OT "6 Clicks" Daily Activity     Outcome Measure Help from another person eating meals?: None Help from another person taking care of personal grooming?: None Help from another person toileting, which includes using toliet, bedpan, or urinal?: None Help from another person bathing (including washing, rinsing, drying)?: None Help from another person to put on and taking off regular upper body clothing?: None Help from another person to put on and taking off regular lower body clothing?: None 6 Click Score: 24   End of Session    Activity Tolerance: Patient tolerated treatment well Patient left: in bed;with call bell/phone within reach  OT Visit Diagnosis: Muscle weakness (generalized) (M62.81)                Time: 7616-0737 OT Time Calculation (min): 28 min Charges:  OT General Charges $OT Visit: 1 Visit OT Evaluation $OT Eval Low Complexity: 1 Low OT Treatments $Self Care/Home Management : 8-22 mins  Quianna Avery H. OTR/L Supplemental OT, Department of rehab services [phone]  Morghan Kester R H. 07/12/2020, 9:38 AM

## 2021-01-19 ENCOUNTER — Other Ambulatory Visit: Payer: Self-pay | Admitting: Family Medicine

## 2021-01-19 DIAGNOSIS — H532 Diplopia: Secondary | ICD-10-CM

## 2021-02-06 ENCOUNTER — Other Ambulatory Visit: Payer: Self-pay

## 2021-02-06 ENCOUNTER — Ambulatory Visit
Admission: RE | Admit: 2021-02-06 | Discharge: 2021-02-06 | Disposition: A | Discharge status: Home / Self Care | Payer: Medicare Other | Source: Ambulatory Visit | Attending: Family Medicine | Admitting: Family Medicine

## 2021-02-06 DIAGNOSIS — H532 Diplopia: Secondary | ICD-10-CM

## 2021-02-06 MED ORDER — IOPAMIDOL (ISOVUE-300) INJECTION 61%
75.0000 mL | Freq: Once | INTRAVENOUS | Status: AC | PRN
  Administered 2021-02-06: 75 mL via INTRAVENOUS

## 2021-04-05 ENCOUNTER — Encounter: Payer: Self-pay | Admitting: *Deleted

## 2021-04-06 ENCOUNTER — Telehealth: Payer: Self-pay | Admitting: *Deleted

## 2021-04-06 ENCOUNTER — Ambulatory Visit: Payer: Medicare Other | Admitting: Neurology

## 2021-04-06 ENCOUNTER — Encounter: Payer: Self-pay | Admitting: Neurology

## 2021-04-06 VITALS — BP 124/72 | HR 75 | Ht 63.0 in | Wt 144.5 lb

## 2021-04-06 DIAGNOSIS — H532 Diplopia: Secondary | ICD-10-CM | POA: Diagnosis not present

## 2021-04-06 DIAGNOSIS — R251 Tremor, unspecified: Secondary | ICD-10-CM

## 2021-04-06 NOTE — Progress Notes (Signed)
Chief Complaint  Patient presents with  . Tremor, double vision    New patient  "two bouts of double vision in Feb, sensations of head moving side to side"       ASSESSMENT AND PLAN  Kristine Brooks is a 70 y.o. female   Acute onset transient diplopia  On examination, she has mild bilateral exophoria, right medial rectus muscle weakness, mild double vision on extreme gaze to the left  Differentiation diagnosis: Need to rule out neuromuscular junctional disorder, abnormal thyroid  function not MRI candidate, normal CT head with without contrast, history also not less suggestive of vascular event,    DIAGNOSTIC DATA (LABS, IMAGING, TESTING) - I reviewed patient records, labs, notes, testing and imaging myself where available.  CT Head w/wo on February 06 2021 No evidence of acute intracranial abnormality. An MRI could provide more sensitive evaluation for acute infarct if clinically indicated.  HISTORICAL  Kristine Brooks, is a 70 year old female, seen in request by her primary care physician Dr. Mila Palmer,, for evaluation of diplopia, initial evaluation was on Apr 06, 2021  I reviewed and summarized the referring note. PMHX. HTN HLD. Asthma  She reported 2 episode of sudden onset visual distortion in February 2022  The first episode was on January 09, 2021, she was watching TV, suddenly everything went blurry, as if gel went over her eyes, lasting for couple minutes, and quickly resolved, she denies vertigo, lateralized motor or sensory deficit  The second episode was on January 13, 2021, she was sitting on the toilet, suddenly noticed to doorframe, she then realized she was having double vision, again lasting less than couple minutes, she returned back to normal  She also reported occasionally head titubation that related to strained posturing, denies hand tremor,  She is not on antiplatelet agent  She denies diplopia, denies bulbar limb muscle weakness, no vertigo, no  hearing loss  She had a history of esophagus surgery in the past, has metal clip in her abdomen, not MRI candidate  CT head with without contrast in March 2022 showed no acute abnormality, REVIEW OF SYSTEMS:  Full 14 system review of systems performed and notable only for as above All other review of systems were negative.  PHYSICAL EXAM:   Vitals:   04/06/21 1340  BP: 124/72  Pulse: 75  Weight: 144 lb 8 oz (65.5 kg)  Height: 5\' 3"  (1.6 m)   Not recorded     Body mass index is 25.6 kg/m.  PHYSICAL EXAMNIATION:  Gen: NAD, conversant, well nourised, well groomed                     Cardiovascular: Regular rate rhythm, no peripheral edema, warm, nontender. Eyes: Conjunctivae clear without exudates or hemorrhage Neck: Supple, no carotid bruits. Pulmonary: Clear to auscultation bilaterally   NEUROLOGICAL EXAM:  MENTAL STATUS: Speech:    Speech is normal; fluent and spontaneous with normal comprehension.  Cognition:     Orientation to time, place and person     Normal recent and remote memory     Normal Attention span and concentration     Normal Language, naming, repeating,spontaneous speech     Fund of knowledge   CRANIAL NERVES: CN II: Visual fields are full to confrontation. Pupils are round equal and briskly reactive to light. CN III, IV, VI: extraocular movement are normal. No ptosis. Red lens testing showed right medial rectus weakness, cover and uncover testing showed mild bilateral exophoria CN V: Facial  sensation is intact to light touch CN VII: Face is symmetric with normal eye closure  CN VIII: Hearing is normal to causal conversation. CN IX, X: Phonation is normal. CN XI: Head turning and shoulder shrug are intact  MOTOR: There is no pronator drift of out-stretched arms. Muscle bulk and tone are normal. Muscle strength is normal.  REFLEXES: Reflexes are 2+ and symmetric at the biceps, triceps, knees, and ankles. Plantar responses are  flexor.  SENSORY: Intact to light touch, pinprick and vibratory sensation are intact in fingers and toes.  COORDINATION: There is no trunk or limb dysmetria noted.  GAIT/STANCE: Posture is normal. Gait is steady with normal steps, base, arm swing, and turning. Heel and toe walking are normal. Tandem gait is normal.  Romberg is absent.  ALLERGIES: Allergies  Allergen Reactions  . Ciprofloxacin Other (See Comments)    Mouth ulcers   . Tetracyclines & Related Other (See Comments)    Mouth ulcers  . Diovan [Valsartan] Cough  . Ace Inhibitors Cough  . Angiotensin Receptor Blockers Cough  . Lotrel [Amlodipine Besy-Benazepril Hcl] Cough  . Sulfa Antibiotics Nausea Only    HOME MEDICATIONS: Current Outpatient Medications  Medication Sig Dispense Refill  . adapalene (DIFFERIN) 0.1 % gel Apply 1 application topically at bedtime.    Marland Kitchen aliskiren (TEKTURNA) 300 MG tablet Take 300 mg by mouth daily.    Marland Kitchen amLODipine (NORVASC) 10 MG tablet Take 10 mg by mouth daily.    . Biotin 1000 MCG tablet daily.    . Calcium Carbonate-Vitamin D3 600-400 MG-UNIT TABS Take 1 tablet by mouth daily.    Marland Kitchen denosumab (PROLIA) 60 MG/ML SOSY injection Inject 60 mg into the skin every 6 (six) months.    . fexofenadine (ALLEGRA) 180 MG tablet Take 180 mg by mouth daily.    . fluocinonide (LIDEX) 0.05 % external solution APPLY 1 ML TOPICALLY TO SKIN (SCALP) ONCE DAILY NIGHTLY    . fluticasone (FLONASE) 50 MCG/ACT nasal spray Place 2 sprays into both nostrils daily.    Marland Kitchen GLUCOSAMINE SULFATE PO Take 2 tablets by mouth daily. InvigoFlex GS Glucosamine Sulfate    . HYDROcodone-acetaminophen (NORCO/VICODIN) 5-325 MG tablet Take 1 tablet by mouth every 6 (six) hours as needed (back pain.).     Marland Kitchen ketoconazole (NIZORAL) 2 % shampoo Apply 1 application topically 2 (two) times a week.    . metroNIDAZOLE (METROCREAM) 0.75 % cream Apply 1 application topically 2 (two) times daily. Rosacea    . Multiple Vitamin (MULTIVITAMIN  WITH MINERALS) TABS tablet Take 1 tablet by mouth daily. Centrum Silver    . Probiotic Product (PROBIOTIC PO) Take 1 capsule by mouth daily. Ultimate Flora    . psyllium (METAMUCIL) 0.52 g capsule Take 3.12 g by mouth daily.    . rosuvastatin (CRESTOR) 5 MG tablet Take 5 mg by mouth at bedtime.    . Cholecalciferol (VITAMIN D) 2000 UNITS tablet Take 2,000 Units by mouth daily. (Patient not taking: Reported on 04/06/2021)    . methocarbamol (ROBAXIN) 500 MG tablet Take 1 tablet (500 mg total) by mouth every 6 (six) hours as needed for muscle spasms. (Patient not taking: Reported on 04/06/2021) 45 tablet 0   No current facility-administered medications for this visit.    PAST MEDICAL HISTORY: Past Medical History:  Diagnosis Date  . 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 Asthma    per patient hx of gastric asthma  . Bronchitis   . Double vision   .  GERD (gastroesophageal reflux disease)   . Heart murmur    per patient benign murmur that has been here all her life  . High cholesterol   . Hypertension   . Kidney infection    per patient at the age of 70  . Osteoporosis   . Pneumonia     PAST SURGICAL HISTORY: Past Surgical History:  Procedure Laterality Date  . BREAST BIOPSY Right 2016  . NISSEN FUNDOPLICATION  2000  . SPINAL FUSION  2021   L4-5  . TONSILLECTOMY    . TUBAL LIGATION  1985   per patient 12 or 1985    FAMILY HISTORY: Family History  Problem Relation Age of Onset  . Osteoporosis Mother   . Stroke Mother   . Heart failure Father   . Heart attack Father   . Osteoporosis Sister   . Skin cancer Brother        melanoma  . Stroke Maternal Grandfather   . Stroke Paternal Grandmother   . Bone cancer Paternal Grandfather   . Breast cancer Neg Hx     SOCIAL HISTORY: Social History   Socioeconomic History  . Marital status: Married    Spouse name: Not on file  . Number of children: Not on file  . Years of education: Not on file  . Highest education  level: Master's degree (e.g., MA, MS, MEng, MEd, MSW, MBA)  Occupational History    Comment: retired  Tobacco Use  . Smoking status: Never Smoker  . Smokeless tobacco: Never Used  Vaping Use  . Vaping Use: Never used  Substance and Sexual Activity  . Alcohol use: Yes    Comment: 1-2 a week  . Drug use: Never  . Sexual activity: Not on file  Other Topics Concern  . Not on file  Social History Narrative   Lives with spouse   caffeine 20 oz Coke daily   Social Determinants of Health   Financial Resource Strain: Not on file  Food Insecurity: Not on file  Transportation Needs: Not on file  Physical Activity: Not on file  Stress: Not on file  Social Connections: Not on file  Intimate Partner Violence: Not on file      Levert Feinstein, M.D. Ph.D.  [facility] [address] [place], [place] [address] Ph: [phone] Fax: [phone]  CC:  Mila Palmer, MD [address] [place],  [place] [address]  Mila Palmer, MD

## 2021-04-06 NOTE — Telephone Encounter (Signed)
Received via my chart PCP labs from 06/15/20 and 01/19/21. Placed on Dr Zannie Cove desk for review when she returns to office next Tues.

## 2021-04-10 LAB — C-REACTIVE PROTEIN: CRP: <1 mg/L (ref 0–10)

## 2021-04-10 LAB — ACETYLCHOLINE RECEPTOR, BINDING: AChR Binding Ab, Serum: <0.03 nmol/L (ref 0.00–0.24)

## 2021-04-10 LAB — HGB A1C W/O EAG: Hgb A1c MFr Bld: 5.8 % — ABNORMAL HIGH (ref 4.8–5.6)

## 2021-04-10 LAB — SEDIMENTATION RATE: Sed Rate: 3 mm/hr (ref 0–40)

## 2021-04-10 LAB — TSH: TSH: 2.11 u[IU]/mL (ref 0.450–4.500)

## 2021-04-10 LAB — ANA W/REFLEX IF POSITIVE: Anti Nuclear Antibody (ANA): NEGATIVE

## 2021-04-11 NOTE — Telephone Encounter (Signed)
Reviewed Lab, normal CBC, Hg 13.9

## 2021-04-18 ENCOUNTER — Ambulatory Visit: Payer: Medicare Other | Admitting: Neurology

## 2021-05-18 ENCOUNTER — Other Ambulatory Visit: Payer: Self-pay | Admitting: Family Medicine

## 2021-05-18 DIAGNOSIS — Z1231 Encounter for screening mammogram for malignant neoplasm of breast: Secondary | ICD-10-CM

## 2021-06-16 ENCOUNTER — Ambulatory Visit: Payer: Medicare Other | Admitting: Podiatry

## 2021-06-16 ENCOUNTER — Ambulatory Visit (INDEPENDENT_AMBULATORY_CARE_PROVIDER_SITE_OTHER): Payer: Medicare Other

## 2021-06-16 ENCOUNTER — Other Ambulatory Visit: Payer: Self-pay

## 2021-06-16 DIAGNOSIS — M216X9 Other acquired deformities of unspecified foot: Secondary | ICD-10-CM

## 2021-06-16 DIAGNOSIS — M722 Plantar fascial fibromatosis: Secondary | ICD-10-CM

## 2021-06-16 MED ORDER — BETAMETHASONE SOD PHOS & ACET 6 (3-3) MG/ML IJ SUSP
6.0000 mg | Freq: Once | INTRAMUSCULAR | Status: AC
  Administered 2021-06-16: 6 mg

## 2021-06-16 NOTE — Progress Notes (Signed)
  Subjective:  Patient ID: Kristine Brooks, female    DOB: 20-Nov-1951,  MRN: [id number]  Chief Complaint  Patient presents with   Foot Pain    Left heel pain since the end of may     70 y.o. female presents with the above complaint. History confirmed with patient.   Objective:  Physical Exam: warm, good capillary refill, no trophic changes or ulcerative lesions, normal DP and PT pulses, and normal sensory exam. Left Foot: tenderness to palpation medial calcaneal tuber, no pain with calcaneal squeeze, decreased ankle joint ROM, and +Silverskiold test   Radiographs: X-ray of the left foot: no evidence of calcaneal stress fracture, plantar calcaneal spur, posterior calcaneal spur, and Haglund deformity noted  Assessment:   1. Plantar fasciitis   2. Equinus deformity of foot    Plan:  Patient was evaluated and treated and all questions answered.  Plantar Fasciitis -XR reviewed with patient -Educated patient on stretching and icing of the affected limb -Plantar fascial brace dispensed -Injection delivered to the plantar fascia of the left foot.  Procedure: Injection Tendon/Ligament Consent: Verbal consent obtained. Location: Left plantar fascia at the glabrous junction; medial approach. Skin Prep: Alcohol. Injectate: 1 cc 0.5% marcaine plain, 1 cc betamethasone acetate-betamethasone sodium phosphate Disposition: Patient tolerated procedure well. Injection site dressed with a band-aid.  Return in about 2 weeks (around 06/30/2021).

## 2021-06-16 NOTE — Patient Instructions (Signed)

## 2021-07-04 ENCOUNTER — Other Ambulatory Visit: Payer: Self-pay

## 2021-07-04 ENCOUNTER — Ambulatory Visit: Payer: Medicare Other | Admitting: Podiatry

## 2021-07-04 DIAGNOSIS — M722 Plantar fascial fibromatosis: Secondary | ICD-10-CM

## 2021-07-04 MED ORDER — BETAMETHASONE SOD PHOS & ACET 6 (3-3) MG/ML IJ SUSP
6.0000 mg | Freq: Once | INTRAMUSCULAR | Status: AC
  Administered 2021-07-04: 6 mg

## 2021-07-04 NOTE — Progress Notes (Signed)
  Subjective:  Patient ID: Kristine Brooks, female    DOB: 01-Oct-1951,  MRN: [id number]  Chief Complaint  Patient presents with   Plantar Fasciitis    Follow up left heel pain. Pt states heel pain came back after 2 weeks. Pt want to go over exercises with Dr.Marisah Laker today because she states they cause her back pain.    70 y.o. female presents with the above complaint. History confirmed with patient. States pain has been getting better over the last few days.  Objective:  Physical Exam: warm, good capillary refill, no trophic changes or ulcerative lesions, normal DP and PT pulses, and normal sensory exam. Left Foot: tenderness to palpation medial calcaneal tuber, no pain with calcaneal squeeze, decreased ankle joint ROM, and +Silverskiold test    Assessment:   1. Plantar fasciitis    Plan:  Patient was evaluated and treated and all questions answered.  Plantar Fasciitis  -Educated patient on stretching and icing of the affected limb -Night splint dispensed -Injection delivered to the plantar fascia of the left foot.  Procedure: Injection Tendon/Ligament Consent: Verbal consent obtained. Location: Left plantar fascia at the glabrous junction; medial approach. Skin Prep: Alcohol. Injectate: 1 cc 0.5% marcaine plain, 1 cc betamethasone acetate-betamethasone sodium phosphate Disposition: Patient tolerated procedure well. Injection site dressed with a band-aid.   No follow-ups on file.

## 2021-07-12 ENCOUNTER — Other Ambulatory Visit: Payer: Self-pay

## 2021-07-12 ENCOUNTER — Ambulatory Visit
Admission: RE | Admit: 2021-07-12 | Discharge: 2021-07-12 | Disposition: A | Discharge status: Home / Self Care | Payer: Medicare Other | Source: Ambulatory Visit | Attending: Family Medicine | Admitting: Family Medicine

## 2021-07-12 DIAGNOSIS — Z1231 Encounter for screening mammogram for malignant neoplasm of breast: Secondary | ICD-10-CM

## 2021-08-01 ENCOUNTER — Ambulatory Visit: Payer: Medicare Other | Admitting: Podiatry

## 2021-08-01 ENCOUNTER — Other Ambulatory Visit: Payer: Self-pay

## 2021-08-01 DIAGNOSIS — M722 Plantar fascial fibromatosis: Secondary | ICD-10-CM

## 2021-08-01 DIAGNOSIS — M216X9 Other acquired deformities of unspecified foot: Secondary | ICD-10-CM

## 2021-08-01 NOTE — Progress Notes (Signed)
  Subjective:  Patient ID: Kristine Brooks, female    DOB: 22-Dec-1950,  MRN: [id number]  Chief Complaint  Patient presents with   Plantar Fasciitis    Follow up left heel pain. Pt states pain increased 1 week ago. She states she has question for Dr. Samuella Cota regarding the exercises.    70 y.o. female presents with the above complaint. History confirmed with patient. States the foot was doing better until she got out of the brace and was doing more aggressive stretching, though it did feel better at first.  Objective:  Physical Exam: warm, good capillary refill, no trophic changes or ulcerative lesions, normal DP and PT pulses, and normal sensory exam. Left Foot: tenderness to palpation medial calcaneal tuber, no pain with calcaneal squeeze, decreased ankle joint ROM, and +Silverskiold test    Assessment:   1. Plantar fasciitis   2. Equinus deformity of foot     Plan:  Patient was evaluated and treated and all questions answered.  Plantar Fasciitis  -Overal improving. -Hold injection today. -Recc voltaren gel 2-3x daily -Resume PF brace x 2 weeks -Hold stretching -F/u in 1 month for recheck  No follow-ups on file.

## 2021-08-15 ENCOUNTER — Other Ambulatory Visit: Payer: Self-pay

## 2021-08-15 ENCOUNTER — Ambulatory Visit: Payer: Medicare Other | Admitting: Cardiology

## 2021-08-15 ENCOUNTER — Encounter: Payer: Self-pay | Admitting: Cardiology

## 2021-08-15 VITALS — BP 135/81 | HR 78 | Temp 98.7°F | Resp 17 | Ht 63.0 in | Wt 142.6 lb

## 2021-08-15 DIAGNOSIS — Z8249 Family history of ischemic heart disease and other diseases of the circulatory system: Secondary | ICD-10-CM

## 2021-08-15 DIAGNOSIS — I10 Essential (primary) hypertension: Secondary | ICD-10-CM

## 2021-08-15 DIAGNOSIS — R002 Palpitations: Secondary | ICD-10-CM

## 2021-08-15 DIAGNOSIS — E7800 Pure hypercholesterolemia, unspecified: Secondary | ICD-10-CM

## 2021-08-15 MED ORDER — ROSUVASTATIN CALCIUM 10 MG PO TABS: 5.0000 mg | ORAL_TABLET | Freq: Every day | ORAL | 0 refills | Status: DC

## 2021-08-15 NOTE — Progress Notes (Signed)
Primary Physician/Referring:  Jonathon Jordan, MD  Patient ID: Kristine Brooks, female    DOB: [birth year], 70 y.o.   MRN: [id number]  Chief Complaint  Patient presents with   New Patient (Initial Visit)   Hypertension   Hyperlipidemia   HPI:    Kristine Brooks  is a 70 y.o. with history of hypertension, hyperlipidemia, about 8 to 10 years ago was told to have elevated coronary calcium score and was started on statins, family history of premature heart disease in father who had MI at 89 but she has 3 other siblings sisters who do not have any other significant coronary disease, mother had CVA: At age 13, etiology not known presents to establish cardiac care, was concerned whether she should have a repeat coronary calcium score.  Except for exertion related palpitations with elevated heart rate, she remains asymptomatic.    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   Asthma    per patient hx of gastric asthma   Bronchitis    Double vision    GERD (gastroesophageal reflux disease)    Heart murmur    per patient benign murmur that has been here all her life   High cholesterol    Hypertension    Kidney infection    per patient at the age of 89   Osteoporosis    Pneumonia    Past Surgical History:  Procedure Laterality Date   BREAST BIOPSY Right 6314   NISSEN FUNDOPLICATION  9702   SPINAL FUSION  2021   L4-5   TONSILLECTOMY     TUBAL LIGATION  1985   per patient 62 or 1985   Family History  Problem Relation Age of Onset   Osteoporosis Mother    Stroke Mother 81   Heart failure Father    Heart attack Father        MI at age 51   Congestive Heart Failure Father 71       passed away   Osteoporosis Sister    Heart disease Sister    Osteoporosis Sister    Atrial fibrillation Sister    Skin cancer Brother        melanoma   Stroke Maternal Grandfather    Stroke Paternal Grandmother    Bone cancer Paternal Grandfather    Breast  cancer Neg Hx     Social History   Tobacco Use   Smoking status: Never   Smokeless tobacco: Never  Substance Use Topics   Alcohol use: Yes    Comment: 1-2 a week   Marital Status: Married  ROS  Review of Systems  Cardiovascular:  Positive for palpitations. Negative for chest pain, dyspnea on exertion and leg swelling.  Musculoskeletal:  Positive for back pain.  Gastrointestinal:  Negative for melena.  Objective  Blood pressure 135/81, pulse 78, temperature 98.7 F (37.1 C), temperature source Temporal, resp. rate 17, height _0  (1.6 m), weight 142 lb 9.6 oz (64.7 kg), SpO2 98 %. Body mass index is 25.26 kg/m.  Vitals with BMI 08/15/2021 04/06/2021 07/12/2020  Height _1  _2  -  Weight 142 lbs 10 oz 144 lbs 8 oz -  BMI 63.78 58.8 -  Systolic 502 774 128  Diastolic 81 72 67  Pulse 78 75 69     Physical Exam Neck:     Vascular: No carotid bruit or JVD.  Cardiovascular:     Rate and Rhythm: Normal rate and  regular rhythm.     Pulses: Intact distal pulses.     Heart sounds: Normal heart sounds. No murmur heard.   No gallop.  Pulmonary:     Effort: Pulmonary effort is normal.     Breath sounds: Normal breath sounds.  Abdominal:     General: Bowel sounds are normal.     Palpations: Abdomen is soft.  Musculoskeletal:        General: No swelling.     Laboratory examination:   No results for input(s): NA, K, CL, CO2, GLUCOSE, BUN, CREATININE, CALCIUM, GFRNONAA, GFRAA in the last 8760 hours. CrCl cannot be calculated (Patient's most recent lab result is older than the maximum 21 days allowed.).  CMP Latest Ref Rng & Units 07/04/2020  Glucose 70 - 99 mg/dL 91  BUN 8 - 23 mg/dL 9  Creatinine 0.44 - 1.00 mg/dL 0.59  Sodium 135 - 145 mmol/L 137  Potassium 3.5 - 5.1 mmol/L 4.0  Chloride 98 - 111 mmol/L 102  CO2 22 - 32 mmol/L 26  Calcium 8.9 - 10.3 mg/dL 10.3   CBC Latest Ref Rng & Units 07/04/2020  WBC 4.0 - 10.5 K/uL 5.0  Hemoglobin 12.0 - 15.0 g/dL 13.1  Hematocrit  36.0 - 46.0 % 40.6  Platelets 150 - 400 K/uL 287   Lipid Panel  No results found for: CHOL, TRIG, HDL, CHOLHDL, VLDL, LDLCALC, LDLDIRECT, LABVLDL   HEMOGLOBIN A1C Lab Results  Component Value Date   HGBA1C 5.8 (H) 04/06/2021   TSH Recent Labs    04/06/21 1446  TSH 2.110    External labs:   Cholesterol, total 185.000 m 08/01/2021 HDL 74.000 mg 08/01/2021 LDL 94.000 mg 08/01/2021 Triglycerides 91.000 mg 08/01/2021  A1C 5.800 % 04/06/2021 TSH 2.050 08/01/2021  Hemoglobin 12.900 g/d 08/01/2021 Platelets 334.  Creatinine, Serum 0.670 mg/ 08/01/2021 EGFR 88 mL. Potassium 4.100 mm 08/01/2021 Magnesium N/D ALT (SGPT) 16.000 U/L 08/01/2021  Medications and allergies   Allergies  Allergen Reactions   Ciprofloxacin Other (See Comments)    Mouth ulcers    Tetracyclines & Related Other (See Comments)    Mouth ulcers   Diovan [Valsartan] Cough   Ace Inhibitors Cough   Angiotensin Receptor Blockers Cough   Lotrel [Amlodipine Besy-Benazepril Hcl] Cough   Sulfa Antibiotics Nausea Only     Medication prior to this encounter:   Outpatient Medications Prior to Visit  Medication Sig Dispense Refill   adapalene (DIFFERIN) 0.1 % gel Apply 1 application topically at bedtime.     aliskiren (TEKTURNA) 300 MG tablet Take 300 mg by mouth daily.     amLODipine (NORVASC) 10 MG tablet Take 10 mg by mouth daily.     Calcium Carbonate-Vitamin D3 600-400 MG-UNIT TABS Take 1 tablet by mouth daily.     Cholecalciferol (VITAMIN D) 2000 UNITS tablet Take 2,000 Units by mouth daily.     denosumab (PROLIA) 60 MG/ML SOSY injection Inject 60 mg into the skin every 6 (six) months.     fexofenadine (ALLEGRA) 180 MG tablet Take 1 tablet by mouth daily.     fluocinonide (LIDEX) 0.05 % external solution APPLY 1 ML TOPICALLY TO SKIN (SCALP) ONCE DAILY NIGHTLY     fluticasone (FLONASE) 50 MCG/ACT nasal spray Place 2 sprays into both nostrils daily.     GLUCOSAMINE SULFATE PO Take 2 tablets by mouth daily.  InvigoFlex GS Glucosamine Sulfate     HYDROcodone-acetaminophen (NORCO/VICODIN) 5-325 MG tablet Take 1 tablet by mouth every 6 (six) hours as needed (back pain.).  ketoconazole (NIZORAL) 2 % shampoo Apply 1 application topically 2 (two) times a week.     metroNIDAZOLE (METROCREAM) 0.75 % cream Apply 1 application topically 2 (two) times daily. Rosacea     Multiple Vitamin (MULTIVITAMIN WITH MINERALS) TABS tablet Take 1 tablet by mouth daily. Centrum Silver     Probiotic Product (PROBIOTIC PO) Take 1 capsule by mouth daily. Ultimate Flora     psyllium (METAMUCIL) 0.52 g capsule Take 3.12 g by mouth daily.     fexofenadine (ALLEGRA) 180 MG tablet Take 180 mg by mouth daily.     rosuvastatin (CRESTOR) 5 MG tablet Take 5 mg by mouth at bedtime.     Biotin 1000 MCG tablet daily.     methocarbamol (ROBAXIN) 500 MG tablet Take 1 tablet (500 mg total) by mouth every 6 (six) hours as needed for muscle spasms. (Patient not taking: No sig reported) 45 tablet 0   No facility-administered medications prior to visit.     Medication list after today's encounter   Current Outpatient Medications  Medication Instructions   adapalene (DIFFERIN) 0.1 % gel 1 application, Topical, Daily at bedtime   aliskiren (TEKTURNA) 300 mg, Oral, Daily   amLODipine (NORVASC) 10 mg, Oral, Daily   Calcium Carbonate-Vitamin D3 600-400 MG-UNIT TABS 1 tablet, Oral, Daily   denosumab (PROLIA) 60 mg, Subcutaneous, Every 6 months   fexofenadine (ALLEGRA) 180 MG tablet 1 tablet, Oral, Daily   fluocinonide (LIDEX) 0.05 % external solution APPLY 1 ML TOPICALLY TO SKIN (SCALP) ONCE DAILY NIGHTLY   fluticasone (FLONASE) 50 MCG/ACT nasal spray 2 sprays, Each Nare, Daily   GLUCOSAMINE SULFATE PO 2 tablets, Oral, Daily, InvigoFlex GS Glucosamine Sulfate    HYDROcodone-acetaminophen (NORCO/VICODIN) 5-325 MG tablet 1 tablet, Oral, Every 6 hours PRN   ketoconazole (NIZORAL) 2 % shampoo 1 application, Topical, 2 times weekly   Metamucil  3.12 g, Oral, Daily   metroNIDAZOLE (METROCREAM) 2.83 % cream 1 application, Topical, 2 times daily, Rosacea   Multiple Vitamin (MULTIVITAMIN WITH MINERALS) TABS tablet 1 tablet, Oral, Daily, Centrum Silver    Probiotic Product (PROBIOTIC PO) 1 capsule, Oral, Daily, Ultimate Flora    rosuvastatin (CRESTOR) 5 mg, Oral, Daily at bedtime   Vitamin D 2,000 Units, Oral, Daily    Radiology:   No results found.  Cardiac Studies:   NA  EKG:   EKG 08/15/2021: Normal sinus rhythm with rate of 79 bpm, normal axis.  No evidence of ischemia, single PVC.    Assessment     ICD-10-CM   1. Primary hypertension  I10 EKG 12-Lead    2. Palpitations  R00.2     3. Hypercholesteremia  E78.00 rosuvastatin (CRESTOR) 10 MG tablet    4. Family history of early CAD: Father MI at age 73Y, Mother stroke 25Y.  Z85.49        Medications Discontinued During This Encounter  Medication Reason   Biotin 1000 MCG tablet Error   methocarbamol (ROBAXIN) 500 MG tablet Error   fexofenadine (ALLEGRA) 662 MG tablet Duplicate   rosuvastatin (CRESTOR) 5 MG tablet Reorder    Meds ordered this encounter  Medications   rosuvastatin (CRESTOR) 10 MG tablet    Sig: Take 0.5 tablets (5 mg total) by mouth at bedtime.    Dispense:  90 tablet    Refill:  0   Orders Placed This Encounter  Procedures   EKG 12-Lead   Recommendations:   Kristine Brooks is a 70 y.o. with history of hypertension, hyperlipidemia, about 8 to  10 years ago was told to have elevated coronary calcium score and was started on statins, family history of premature heart disease in father who had MI at 13 but she has 3 other siblings sisters who do not have any other significant coronary disease, mother had CVA: At age 79, etiology not known presents to establish cardiac care, was concerned whether she should have a repeat coronary calcium score.  Except for exertion related palpitations with elevated heart rate, she remains asymptomatic.  She has  been diagnosed with bronchial asthma in the past and I do hear bilateral expiratory wheezing in her lungs.  I suspect her mild dyspnea/palpitations with exertion activity may be related to bronchial asthma than coronary artery disease or angina pectoris.  She still continues to remain fairly active in spite of back pain and bilateral knee arthritis.  Advised her to increase her physical activity as tolerated and also to try to look into water aerobics and elliptical.  Unless she notices dyspnea, chest pain or worsening symptoms, then I would recommend further cardiac evaluation.  With regard to elevated coronary calcium score that occurred years ago, she is only on a minimal dose of Crestor at 5 mg.  She is willing to increase the dose to 10 mg, goal LDL to keep closer to 70 in view of family history of coronary artery disease and known elevated coronary calcium score done remotely.  As she is willing to take the medication, I do not think she needs repeat testing.  I will see her back on a as needed basis.  She can have her lipids checked on a routine annual physical next year.  Blood pressure is fairly well controlled at home, was mildly elevated here today, goal blood pressure 130/80 mmHg.  If blood pressure is not at goal, could certainly consider either addition of Aldactone or hydrochlorothiazide to present medical regimen.    Adrian Prows, MD, [facility] 08/15/2021, 2:20 PM Office: [phone]

## 2021-08-21 ENCOUNTER — Telehealth: Payer: Self-pay | Admitting: Cardiology

## 2021-08-21 NOTE — Telephone Encounter (Signed)
Pt is calling for some clarification on a new med refill

## 2021-08-22 ENCOUNTER — Other Ambulatory Visit: Payer: Self-pay

## 2021-08-22 DIAGNOSIS — E7800 Pure hypercholesterolemia, unspecified: Secondary | ICD-10-CM

## 2021-08-22 MED ORDER — ROSUVASTATIN CALCIUM 10 MG PO TABS: 10.0000 mg | ORAL_TABLET | Freq: Every day | ORAL | 3 refills | Status: AC

## 2021-08-22 NOTE — Telephone Encounter (Signed)
Called pt to see what medication she needed help with. Pt mention that when she came to the office D. Ganji told her that he was going to increase her crestor to 10 instead of 5. But her prescription said 10mg  take .05 table. Changed her Rx to 10mg  take one tab daily. And send the new Rx to her pharmacy.

## 2021-09-15 ENCOUNTER — Ambulatory Visit: Payer: Medicare Other | Admitting: Podiatry

## 2021-09-15 ENCOUNTER — Other Ambulatory Visit: Payer: Self-pay

## 2021-09-15 DIAGNOSIS — M216X9 Other acquired deformities of unspecified foot: Secondary | ICD-10-CM | POA: Diagnosis not present

## 2021-09-15 DIAGNOSIS — M722 Plantar fascial fibromatosis: Secondary | ICD-10-CM

## 2021-09-25 NOTE — Progress Notes (Signed)
  Subjective:  Patient ID: Kristine Brooks, female    DOB: 1951/01/27,  MRN: [id number]  Chief Complaint  Patient presents with   Foot Pain    Pt states improvement with voltaren gel. No new concerns. Discuss maintenance of foot to prevent pain.   70 y.o. female presents with the above complaint. History confirmed with patient.  States that overall she is doing much better.  Things that would hematosalpinx like to discuss inserts and other options to keep her pain today  Objective:  Physical Exam: warm, good capillary refill, no trophic changes or ulcerative lesions, normal DP and PT pulses, and normal sensory exam. Left Foot: no pain at medial calcaneal tuber, no pain with calcaneal squeeze, decreased ankle joint ROM, and +Silverskiold test    Assessment:   1. Plantar fasciitis   2. Equinus deformity of foot      Plan:  Patient was evaluated and treated and all questions answered.  Plantar Fasciitis  -Discussed continued stretching to prevent recurrence. -We will check on CMO coverage.  Advised of likely noncoverage as generally statutory excluded by Medicare plans -Continue Voltaren gel  No follow-ups on file.

## 2022-02-05 ENCOUNTER — Ambulatory Visit
Admission: EM | Admit: 2022-02-05 | Discharge: 2022-02-05 | Disposition: A | Discharge status: Home / Self Care | Payer: Medicare Other | Attending: Family Medicine | Admitting: Family Medicine

## 2022-02-05 ENCOUNTER — Other Ambulatory Visit: Payer: Self-pay

## 2022-02-05 DIAGNOSIS — J3089 Other allergic rhinitis: Secondary | ICD-10-CM | POA: Diagnosis not present

## 2022-02-05 DIAGNOSIS — J209 Acute bronchitis, unspecified: Secondary | ICD-10-CM

## 2022-02-05 MED ORDER — PREDNISONE 20 MG PO TABS: 40.0000 mg | ORAL_TABLET | Freq: Every day | ORAL | 0 refills | Status: DC

## 2022-02-05 MED ORDER — PROMETHAZINE-DM 6.25-15 MG/5ML PO SYRP: 5.0000 mL | ORAL_SOLUTION | Freq: Four times a day (QID) | ORAL | 0 refills | Status: DC | PRN

## 2022-02-05 NOTE — ED Provider Notes (Signed)
[facility]    CSN: [id number] Arrival date & time: 02/05/22  1444      History   Chief Complaint Chief Complaint  Patient presents with   Nasal Congestion    HPI Kristine Brooks is a 71 y.o. female.   Senting today with 1 to 2-week history of nasal congestion, significant rhinorrhea, postnasal drip, sinus headache, hacking cough.  Denies fever, chills, chest pain, shortness of breath, abdominal pain, nausea vomiting or diarrhea.  States she does have a history of seasonal allergies on Allegra and Flonase, stopped the Flonase 3 weeks ago due to some nosebleeds.   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      Home Medications    Prior to Admission medications   Medication Sig Start Date End Date Taking? Authorizing Provider  predniSONE (DELTASONE) 20 MG tablet Take 2 tablets (40 mg total) by mouth daily with breakfast. 02/05/22  Yes Particia Nearing, PA-C  promethazine-dextromethorphan (PROMETHAZINE-DM) 6.25-15 MG/5ML syrup Take 5 mLs by mouth 4 (four) times daily as needed. 02/05/22  Yes Particia Nearing, PA-C  adapalene (DIFFERIN) 0.1 % gel Apply 1 application topically at bedtime.    [provider]   aliskiren (TEKTURNA) 300 MG tablet Take 300 mg by mouth daily.    [provider]  amLODipine (NORVASC) 10 MG tablet Take 10 mg by mouth daily.    [provider]  Calcium Carbonate-Vitamin D3 600-400 MG-UNIT TABS Take 1 tablet by mouth daily.    [provider]  Cholecalciferol (VITAMIN D) 2000 UNITS tablet Take 2,000 Units by mouth daily.    [provider]  denosumab (PROLIA) 60 MG/ML SOSY injection Inject 60 mg into the skin every 6 (six) months.    [provider]  fexofenadine (ALLEGRA) 180 MG tablet Take 1 tablet by mouth daily.    [provider]  fluocinonide (LIDEX) 0.05 % external solution APPLY 1 ML TOPICALLY TO SKIN (SCALP) ONCE DAILY NIGHTLY 11/30/20   [provider]  fluticasone (FLONASE) 50 MCG/ACT nasal spray Place 2 sprays into both nostrils daily.    [provider]  GLUCOSAMINE SULFATE PO Take 2 tablets by mouth daily. InvigoFlex GS Glucosamine Sulfate    [provider]  HYDROcodone-acetaminophen (NORCO/VICODIN) 5-325 MG tablet Take 1 tablet by mouth every 6 (six) hours as needed (back pain.).     [provider]  ketoconazole (NIZORAL) 2 % shampoo Apply 1 application topically 2 (two) times a week.  [provider]  metroNIDAZOLE (METROCREAM) 0.75 % cream Apply 1 application topically 2 (two) times daily. Rosacea    [provider]  Multiple Vitamin (MULTIVITAMIN WITH MINERALS) TABS tablet Take 1 tablet by mouth daily. Centrum Silver    [provider]  Probiotic Product (PROBIOTIC PO) Take 1 capsule by mouth daily. Ultimate Flora    [provider]  psyllium (METAMUCIL) 0.52 g capsule Take 3.12 g by mouth daily.    [provider]  rosuvastatin (CRESTOR) 10 MG tablet Take 1 tablet (10 mg total) by mouth at bedtime. 08/22/21   Yates Decamp, MD    Family History Family History  Problem Relation Age of Onset   Osteoporosis Mother    Stroke  Mother 14   Heart failure Father    Heart attack Father        MI at age 62   Congestive Heart Failure Father 85       passed away   Osteoporosis Sister    Heart disease Sister    Osteoporosis Sister    Atrial fibrillation Sister    Skin cancer Brother        melanoma   Stroke Maternal Grandfather    Stroke Paternal Grandmother    Bone cancer Paternal Grandfather    Breast cancer Neg Hx     Social History Social History   Tobacco Use   Smoking status: Never   Smokeless tobacco: Never  Vaping Use   Vaping Use: Never used  Substance Use Topics   Alcohol use: Yes    Comment: 1-2 a week   Drug use: Never     Allergies   Ciprofloxacin, Tetracyclines & related, Diovan [valsartan], Ace inhibitors, Angiotensin receptor blockers, Lotrel [amlodipine besy-benazepril hcl], and Sulfa antibiotics   Review of Systems Review of Systems PER HPI  Physical Exam Triage Vital Signs ED Triage Vitals  Enc Vitals Group     BP 02/05/22 1529 125/82     Pulse Rate 02/05/22 1529 89     Resp 02/05/22 1529 18     Temp 02/05/22 1529 98.3 F (36.8 C)     Temp src --      SpO2 02/05/22 1529 95 %     Weight --      Height --      Head Circumference --      Peak Flow --      Pain Score 02/05/22 1527 2     Pain Loc --      Pain Edu? --      Excl. in GC? --    No data found.  Updated Vital Signs BP 125/82    Pulse 89    Temp 98.3 F (36.8 C)    Resp 18    LMP  (LMP Unknown)    SpO2 95%   Visual Acuity Right Eye Distance:   Left Eye Distance:   Bilateral Distance:    Right Eye Near:   Left Eye Near:    Bilateral Near:     Physical Exam Vitals and nursing note reviewed.  Constitutional:      Appearance: Normal appearance.  HENT:     Head: Atraumatic.     Right Ear: Tympanic membrane and external ear normal.     Left Ear: Tympanic membrane and external ear normal.     Nose: Rhinorrhea present.     Mouth/Throat:     Mouth: Mucous membranes are moist.     Pharynx:  Posterior oropharyngeal erythema present.  Eyes:     Extraocular Movements: Extraocular movements intact.     Conjunctiva/sclera: Conjunctivae normal.  Cardiovascular:     Rate and Rhythm: Normal rate and regular rhythm.     Heart sounds: Normal heart sounds.  Pulmonary:     Effort: Pulmonary effort is normal.     Breath sounds: Wheezing present. No rales.     Comments: Mild left-sided wheezes Musculoskeletal:        General: Normal range of motion.     Cervical back: Normal range of motion and neck supple.  Skin:    General: Skin is warm and dry.  Neurological:     Mental Status: She is alert and oriented to person, place, and time.  Psychiatric:        Mood and Affect: Mood normal.        Thought Content: Thought content normal.    UC Treatments / Results  Labs (all labs ordered are listed, but only abnormal results are displayed) Labs Reviewed - No data to display  EKG   Radiology No results found.  Procedures Procedures (including critical care time)  Medications Ordered in UC Medications - No data to display  Initial Impression / Assessment and Plan / UC Course  I have reviewed the triage vital signs and the nursing notes.  Pertinent labs & imaging results that were available during my care of the patient were reviewed by me and considered in my medical decision making (see chart for details).     Suspect exacerbation of seasonal allergies and mild bronchitis.  Treat with prednisone, Phenergan DM and switch to Astelin nasal spray to see if this dries up her postnasal drip.  Return for acutely worsening symptoms.  Final Clinical Impressions(s) / UC Diagnoses   Final diagnoses:  Allergic rhinitis due to other allergic trigger, unspecified seasonality  Acute bronchitis, unspecified organism   Discharge Instructions   None    ED Prescriptions     Medication Sig Dispense Auth. Provider   predniSONE (DELTASONE) 20 MG tablet Take 2 tablets (40 mg total) by  mouth daily with breakfast. 10 tablet Particia Nearing, PA-C   promethazine-dextromethorphan (PROMETHAZINE-DM) 6.25-15 MG/5ML syrup Take 5 mLs by mouth 4 (four) times daily as needed. 100 mL Particia Nearing, [place]      PDMP not reviewed this encounter.   Particia Nearing, [place] 02/05/22 1623

## 2022-02-05 NOTE — ED Triage Notes (Signed)
Pt presents with headache and nasal congestion for past week, had URI 2 weeks ago  ?

## 2022-02-14 ENCOUNTER — Other Ambulatory Visit: Payer: Self-pay | Admitting: Family Medicine

## 2022-02-14 DIAGNOSIS — E2839 Other primary ovarian failure: Secondary | ICD-10-CM

## 2022-03-30 ENCOUNTER — Ambulatory Visit: Admit: 2022-03-30 | Payer: Medicare Other | Admitting: Otolaryngology

## 2022-03-30 SURGERY — SEPTOPLASTY, NOSE, WITH NASAL TURBINATE REDUCTION
Anesthesia: General | Laterality: Bilateral

## 2022-06-18 ENCOUNTER — Other Ambulatory Visit: Payer: Self-pay | Admitting: Family Medicine

## 2022-06-18 DIAGNOSIS — Z1231 Encounter for screening mammogram for malignant neoplasm of breast: Secondary | ICD-10-CM

## 2022-07-16 ENCOUNTER — Ambulatory Visit
Admission: RE | Admit: 2022-07-16 | Discharge: 2022-07-16 | Disposition: A | Discharge status: Home / Self Care | Payer: Medicare Other | Source: Ambulatory Visit | Attending: Family Medicine | Admitting: Family Medicine

## 2022-07-16 DIAGNOSIS — Z1231 Encounter for screening mammogram for malignant neoplasm of breast: Secondary | ICD-10-CM

## 2022-07-18 ENCOUNTER — Other Ambulatory Visit: Payer: Self-pay | Admitting: Family Medicine

## 2022-07-18 DIAGNOSIS — R928 Other abnormal and inconclusive findings on diagnostic imaging of breast: Secondary | ICD-10-CM

## 2022-07-27 ENCOUNTER — Ambulatory Visit: Payer: Medicare Other

## 2022-07-27 ENCOUNTER — Ambulatory Visit
Admission: RE | Admit: 2022-07-27 | Discharge: 2022-07-27 | Disposition: A | Discharge status: Home / Self Care | Payer: Medicare Other | Source: Ambulatory Visit | Attending: Family Medicine | Admitting: Family Medicine

## 2022-07-27 DIAGNOSIS — R928 Other abnormal and inconclusive findings on diagnostic imaging of breast: Secondary | ICD-10-CM

## 2022-10-30 ENCOUNTER — Other Ambulatory Visit: Payer: Self-pay | Admitting: Cardiology

## 2022-10-30 DIAGNOSIS — E7800 Pure hypercholesterolemia, unspecified: Secondary | ICD-10-CM

## 2023-02-03 IMAGING — MG MM DIGITAL SCREENING BILAT W/ TOMO AND CAD
8 series · 8 of 24 positions shown · non-contrast
Comparison: Previous exam(s).

CLINICAL DATA: Screening.

EXAM:
DIGITAL SCREENING BILATERAL MAMMOGRAM WITH TOMOSYNTHESIS AND CAD
TECHNIQUE: Bilateral screening digital craniocaudal and mediolateral oblique
mammograms were obtained. Bilateral screening digital breast
tomosynthesis was performed. The images were evaluated with
computer-aided detection.

[L CC synth-2D]
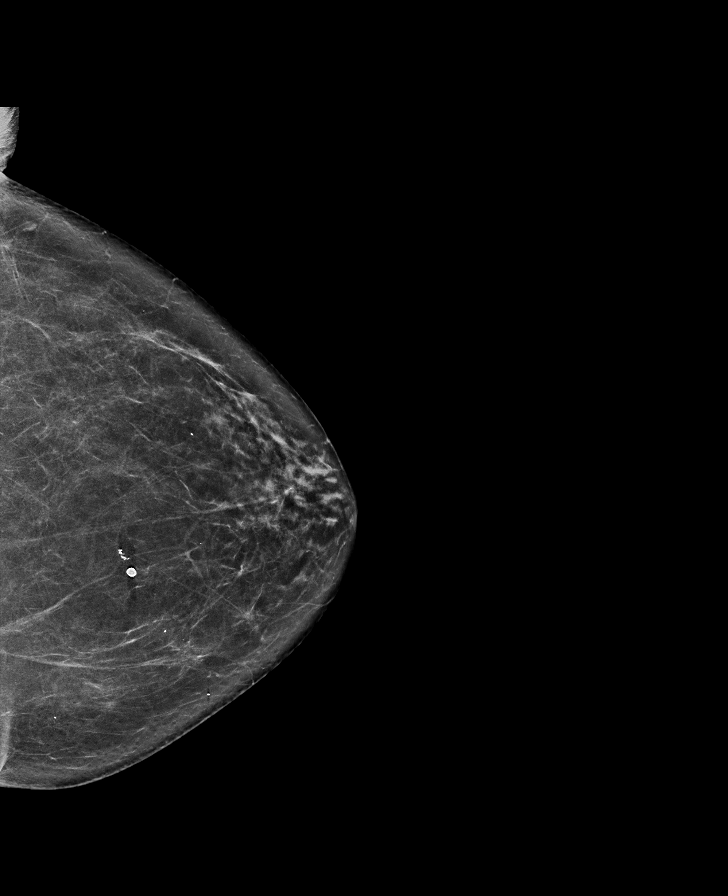

[R CC synth-2D]
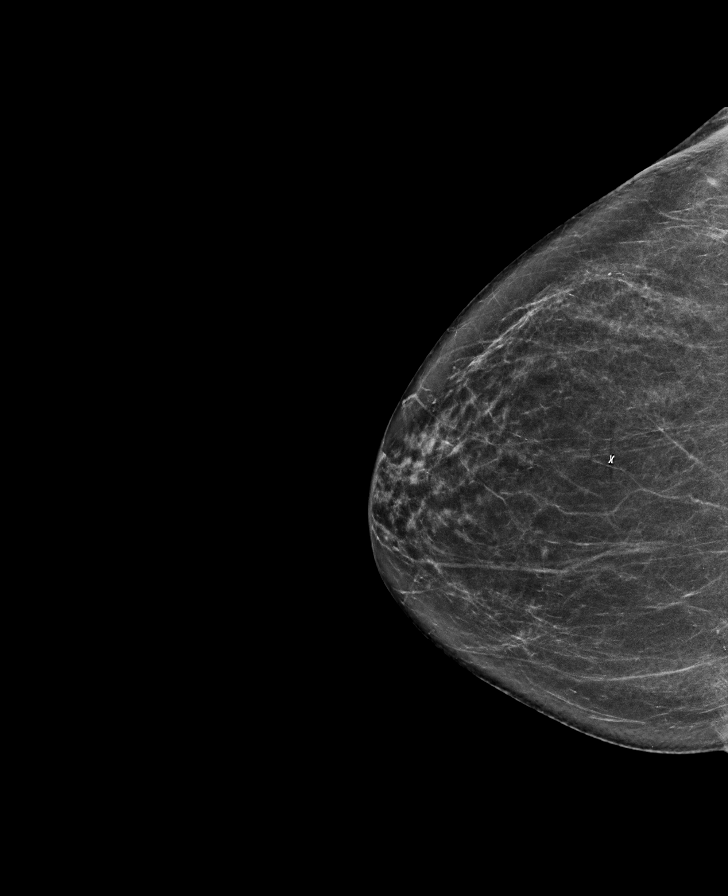

[L MLO synth-2D]
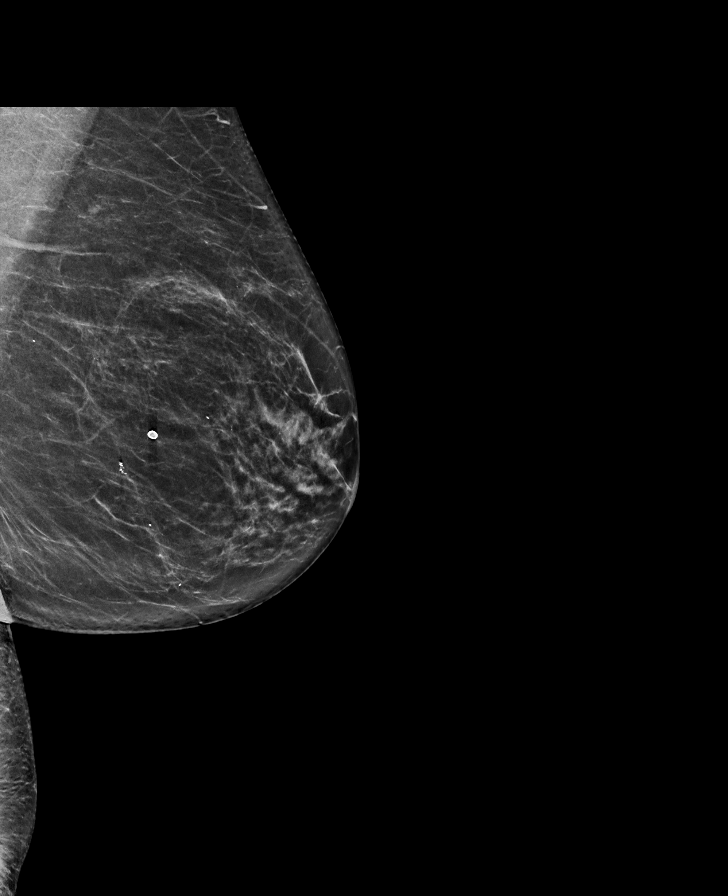

[R MLO synth-2D]
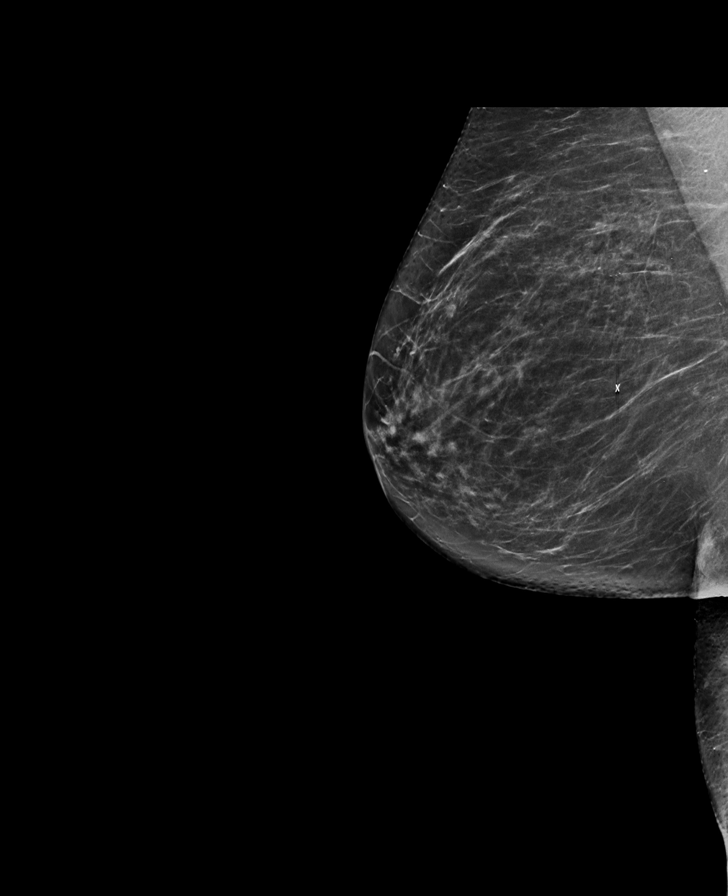

[R MLO tomo · tomo slice 36/71.0]
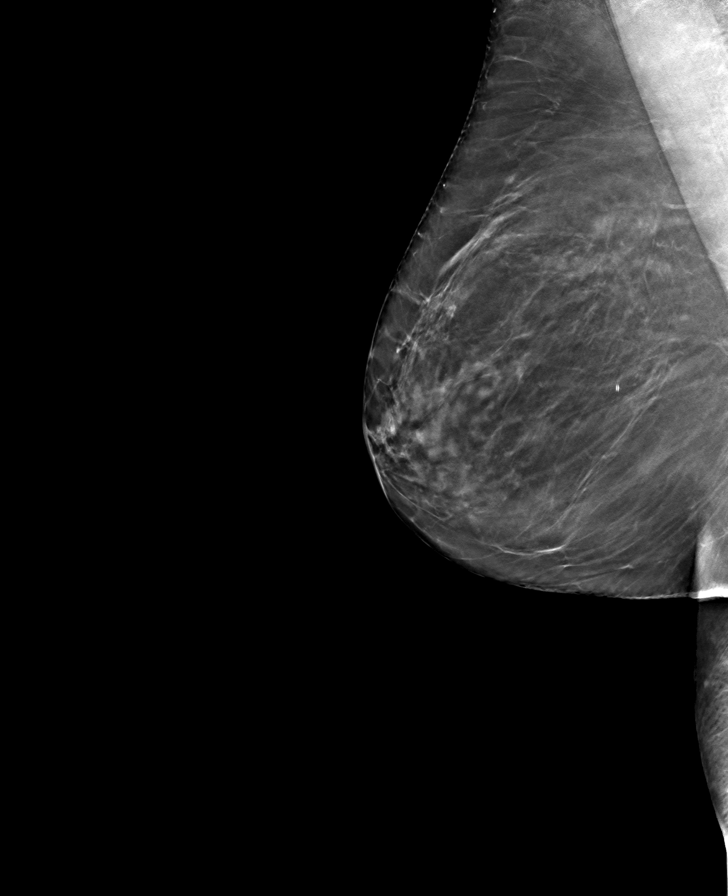

[L MLO tomo · tomo slice 34/67.0]
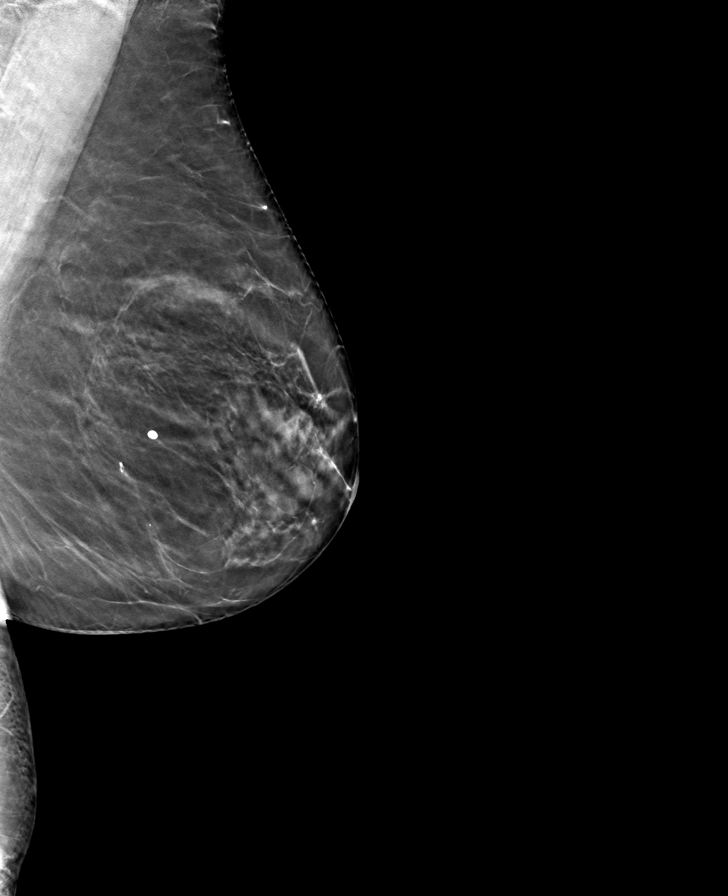

[R CC tomo · tomo slice 33/66.0]
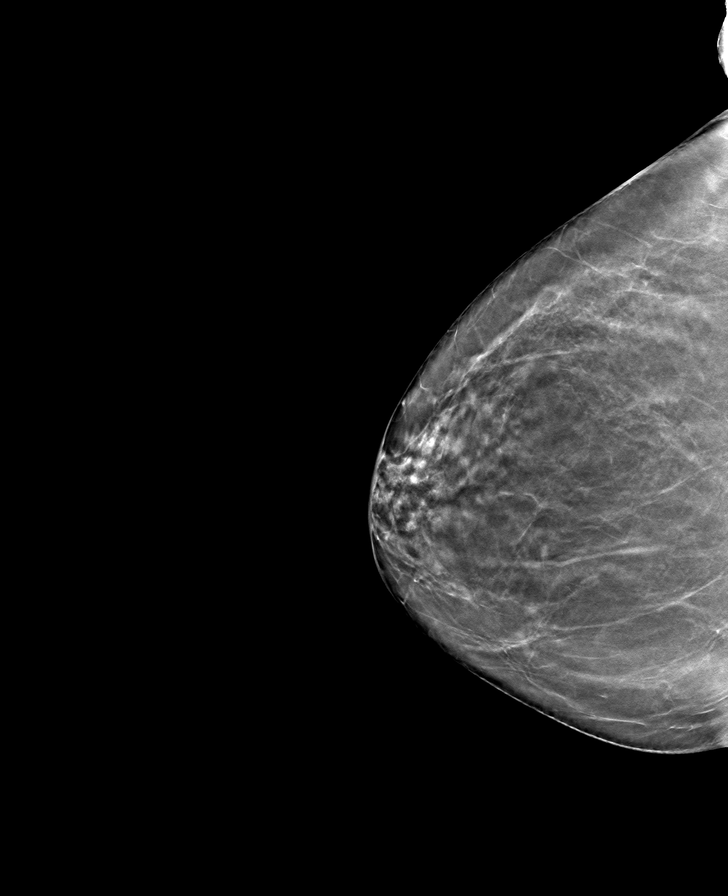

[L CC tomo · tomo slice 35/68.0]
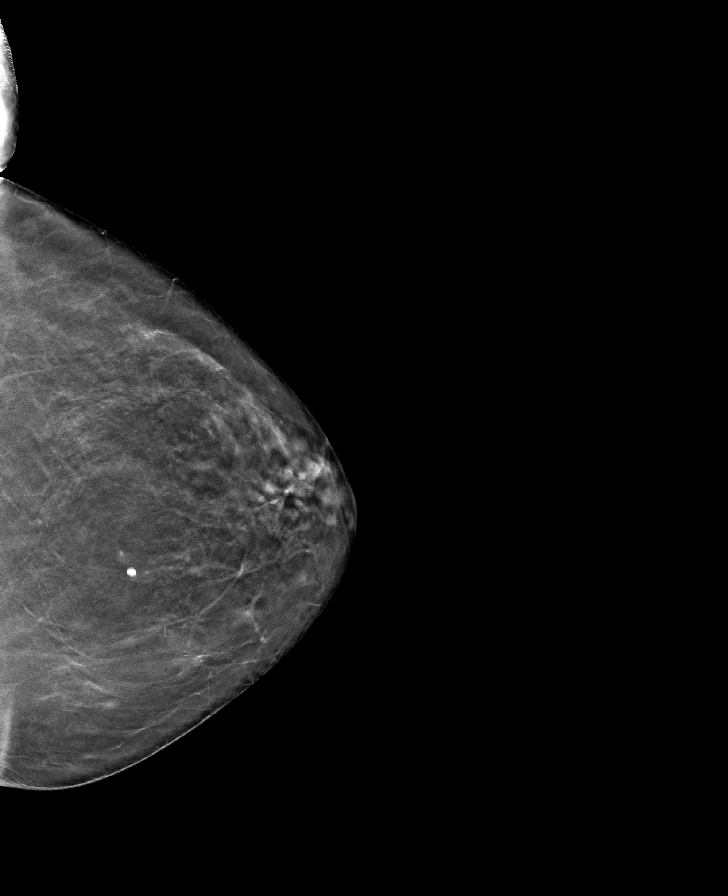

[8 of 24 positions shown; findings below may reference images not displayed]

ACR Breast Density Category b: There are scattered areas of
fibroglandular density.
FINDINGS: There are no findings suspicious for malignancy.
IMPRESSION: No mammographic evidence of malignancy. A result letter of this
screening mammogram will be mailed directly to the patient.

RECOMMENDATION:
Screening mammogram in one year. (Code:51-O-LD2)

BI-RADS CATEGORY  1: Negative.

## 2023-07-10 ENCOUNTER — Other Ambulatory Visit: Payer: Self-pay | Admitting: Family Medicine

## 2023-07-10 DIAGNOSIS — Z0000 Encounter for general adult medical examination without abnormal findings: Secondary | ICD-10-CM

## 2023-07-23 ENCOUNTER — Ambulatory Visit: Admission: RE | Admit: 2023-07-23 | Payer: Medicare Other | Source: Ambulatory Visit

## 2023-07-23 DIAGNOSIS — Z0000 Encounter for general adult medical examination without abnormal findings: Secondary | ICD-10-CM

## 2024-06-12 ENCOUNTER — Ambulatory Visit: Attending: Cardiology | Admitting: Cardiology

## 2024-06-12 ENCOUNTER — Encounter: Payer: Self-pay | Admitting: Cardiology

## 2024-06-12 VITALS — BP 127/76 | HR 69 | Resp 16 | Ht 63.0 in | Wt 151.0 lb

## 2024-06-12 DIAGNOSIS — Z0000 Encounter for general adult medical examination without abnormal findings: Secondary | ICD-10-CM | POA: Diagnosis not present

## 2024-06-12 DIAGNOSIS — I10 Essential (primary) hypertension: Secondary | ICD-10-CM

## 2024-06-12 NOTE — Progress Notes (Signed)
 Cardiology Office Note:  .   Date:  06/13/2024  ID:  Kristine Brooks, DOB July 05, 1951, MRN [id number] PCP: Verena Mems, MD  [facility] Providers Cardiologist:  Gordy Bergamo, MD   History of Present Illness: .   Kristine Brooks is a 73 y.o. with history of hypertension, hyperlipidemia, about 8 to 10 years ago was told to have elevated coronary calcium  score and was started on statins, family history of premature heart disease in father who had MI at 78 but she has 3 other siblings sisters who do not have any other significant coronary disease, mother had CVA: At age 10.  She presents with concerns about cardiovascular health and primary prevention.  Remains asymptomatic.  Discussed the use of AI scribe software for clinical note transcription with the patient, who gave verbal consent to proceed.  History of Present Illness Kristine Brooks is a 73 year old female who presents with concerns about cardiovascular health due to family history of stroke and heart failure.  She is concerned about her cardiovascular health due to her family history. Her youngest sister had an ocular stroke with vision loss, and her oldest sister was hospitalized for fluid in her lungs, raising concerns about heart failure. She is interested in preventative measures to avoid similar issues.  Her blood pressure medications, aliskiren  300 mg and amlodipine  10 mg daily, have not been adjusted in decades. She experienced an episode of stress a month ago with a blood pressure of 150/80 mmHg, which returned to 110-120/70s mmHg after an hour and a half.  She engages in regular physical activity, walking on a treadmill for 30-45 minutes at 1.5 miles per hour, which she finds manageable.  Recent laboratory results show a total cholesterol of 163 mg/dL, triglycerides of 83 mg/dL, HDL of 83 mg/dL, LDL of 65 mg/dL, and blood sugar of 87 mg/dL. Other lab values, including hemoglobin, hematocrit, platelet count, and kidney  function tests, are within normal limits. Her blood pressure typically ranges from 110-120/70s mmHg when not under stress.   Labs   External Labs:  PCP labs on Care Everywhere 11/07/2023:  CBC normal, CMP normal with EGFR 65 mL, LFTs normal. TSH normal at 2.45, vitamin D47.8, normal.  Total cholesterol 163, triglycerides 83, HDL 83, LDL 65.  ROS  Review of Systems  Cardiovascular:  Negative for chest pain, dyspnea on exertion and leg swelling.   Physical Exam:   VS:  BP 127/76 (BP Location: Left Arm, Patient Position: Sitting, Cuff Size: Normal)   Pulse 69   Resp 16   Ht 5' 3 (1.6 m)   Wt 151 lb (68.5 kg)   LMP  (LMP Unknown)   SpO2 98%   BMI 26.75 kg/m    Wt Readings from Last 3 Encounters:  06/12/24 151 lb (68.5 kg)  08/15/21 142 lb 9.6 oz (64.7 kg)  04/06/21 144 lb 8 oz (65.5 kg)    Physical Exam Neck:     Vascular: No carotid bruit or JVD.  Cardiovascular:     Rate and Rhythm: Normal rate and regular rhythm.     Pulses: Intact distal pulses.     Heart sounds: Normal heart sounds. No murmur heard.    No gallop.  Pulmonary:     Effort: Pulmonary effort is normal.     Breath sounds: Normal breath sounds.  Abdominal:     General: Bowel sounds are normal.     Palpations: Abdomen is soft.  Musculoskeletal:     Right lower leg:  No edema.     Left lower leg: No edema.    Studies Reviewed: SABRA     EKG:    EKG Interpretation Date/Time:  Friday June 12 2024 14:47:46 EDT Ventricular Rate:  71 PR Interval:  138 QRS Duration:  76 QT Interval:  384 QTC Calculation: 417 R Axis:   -7  Text Interpretation: EKG 06/12/2024: Normal sinus rhythm with rate of 71 bpm, normal EKG.  Compared to 07/04/2020, no significant change. Confirmed by Izaiyah Kleinman, Jagadeesh ([id number]) on 06/12/2024 3:29:22 PM    Medications ordered    No orders of the defined types were placed in this encounter.    ASSESSMENT AND PLAN: .      ICD-10-CM   1. Primary hypertension  I10 EKG 12-Lead    CT  CARDIAC SCORING (SELF PAY ONLY)    2. Encounter for preventive care  Z00.00      Assessment & Plan Cardiovascular Risk Assessment Concern about family history of cardiovascular events, including sister's ocular stroke and another sister's potential congestive heart failure. Seeking reassurance about her own cardiovascular health and prevention of similar events. - Order coronary calcium  score to assess cardiovascular risk - Communicate results via MyChart and discuss further testing if necessary  Hypertension Hypertension is well-controlled with current medication regimen. Recent blood pressure readings were elevated during stress but returned to normal range. Current medications include aliskiren  300 mg and amlodipine  10 mg daily. Blood pressure today was 127/36, which is satisfactory. Discussed that blood pressure should be measured when relaxed and not under stress. Explained that stress and physiological factors like a full bladder can temporarily elevate blood pressure. - Continue aliskiren  300 mg once daily - Continue amlodipine  10 mg once daily - Monitor blood pressure regularly, ensuring measurements are taken when relaxed  Hyperlipidemia Hyperlipidemia is well-managed. Recent cholesterol panel from September 2024 shows total cholesterol at 163, triglycerides at 83, HDL at 83, and LDL at 65. These values indicate good control of lipid levels. Discussed the importance of maintaining these levels for cardiovascular health. - PCP labs including CMP, CBC and lipid profile reviewed with the patient.  OV PRN  Signed,  Gordy Bergamo, MD, [facility] 06/13/2024, 7:59 AM [facility] [address] [place], [place] [address] Phone: [phone]. Fax:  [phone]

## 2024-06-12 NOTE — Patient Instructions (Signed)
 Medication Instructions:  Your physician recommends that you continue on your current medications as directed. Please refer to the Current Medication list given to you today.  *If you need a refill on your cardiac medications before your next appointment, please call your pharmacy*  Lab Work: none If you have labs (blood work) drawn today and your tests are completely normal, you will receive your results only by: MyChart Message (if you have MyChart) OR A paper copy in the mail If you have any lab test that is abnormal or we need to change your treatment, we will call you to review the results.  Testing/Procedures: Dr Ladona has requested you have a coronary calcium  score  CT  Follow-Up: At [facility], you and your health needs are our priority.  As part of our continuing mission to provide you with exceptional heart care, our providers are all part of one team.  This team includes your primary Cardiologist (physician) and Advanced Practice Providers or APPs (Physician Assistants and Nurse Practitioners) who all work together to provide you with the care you need, when you need it.  Your next appointment:   As needed  Provider:   Gordy Ladona, MD    We recommend signing up for the patient portal called MyChart.  Sign up information is provided on this After Visit Summary.  MyChart is used to connect with patients for Virtual Visits (Telemedicine).  Patients are able to view lab/test results, encounter notes, upcoming appointments, etc.  Non-urgent messages can be sent to your provider as well.   To learn more about what you can do with MyChart, go to [email or web address].   Other Instructions

## 2024-06-15 ENCOUNTER — Other Ambulatory Visit: Payer: Self-pay

## 2024-06-15 ENCOUNTER — Encounter: Payer: Self-pay | Admitting: Emergency Medicine

## 2024-06-15 ENCOUNTER — Ambulatory Visit
Admission: EM | Admit: 2024-06-15 | Discharge: 2024-06-15 | Disposition: A | Discharge status: Home / Self Care | Attending: Nurse Practitioner | Admitting: Nurse Practitioner

## 2024-06-15 DIAGNOSIS — R399 Unspecified symptoms and signs involving the genitourinary system: Secondary | ICD-10-CM | POA: Insufficient documentation

## 2024-06-15 LAB — POCT URINALYSIS DIP (MANUAL ENTRY)
Bilirubin, UA: NEGATIVE
Glucose, UA: NEGATIVE mg/dL
Ketones, POC UA: NEGATIVE mg/dL
Nitrite, UA: NEGATIVE
Protein Ur, POC: 30 mg/dL — AB
Spec Grav, UA: 1.015 (ref 1.010–1.025)
Urobilinogen, UA: 0.2 U/dL
pH, UA: 5.5 (ref 5.0–8.0)

## 2024-06-15 MED ORDER — CEPHALEXIN 500 MG PO CAPS: 500.0000 mg | ORAL_CAPSULE | Freq: Four times a day (QID) | ORAL | 0 refills | Status: AC

## 2024-06-15 NOTE — Discharge Instructions (Signed)
 A urine culture has been ordered.  You will be contacted if the results of the culture are abnormal, you may also be advised to discontinue the antibiotic if the culture is negative. Take medication as prescribed. Increase fluids.  Make sure you are drinking at least 8-10 8 ounce glasses of water daily. You may take over-the-counter Tylenol  as needed for pain, fever, or general discomfort. Develop a toileting schedule that will allow you to urinate at least every 2 hours. Avoid caffeine such as tea, soda, or coffee while symptoms persist. If your culture result is negative and you are continuing to experience symptoms, please follow-up with your primary care physician for further evaluation. Follow-up as needed.

## 2024-06-15 NOTE — ED Provider Notes (Signed)
 [facility]    CSN: [id number] Arrival date & time: 06/15/24  1310      History   Chief Complaint Chief Complaint  Patient presents with   Dysuria    HPI Crystelle Ferrufino is a 73 y.o. female.   The history is provided by the patient.   Patient presents for complaints of pain with urination and urinary frequency.  Patient states over the past several weeks, she had some vaginal irritation, but woke up this morning with pain with urination and pressure after urination, noting that the pain is worse when her bladder is full..  She denies fever, chills, abdominal pain, nausea, vomiting, or diarrhea.  She further denies hematuria, urinary urgency, decreased urine stream, flank pain, or low back pain.  Patient denies prior history of recurrent urinary tract infections.  States that she did have a kidney infection around the age of 52, denies history of kidney stones.  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      Home Medications    Prior to Admission medications   Medication Sig Start Date End Date Taking? Authorizing Provider  adapalene  (DIFFERIN ) 0.1 % gel Apply 1 application topically at bedtime.    [provider]   aliskiren  (TEKTURNA ) 300 MG tablet Take 300 mg by mouth daily.    [provider]  amLODipine  (NORVASC ) 10 MG tablet Take 10 mg by mouth daily.    [provider]  Calcium  Carbonate-Vitamin D3 600-400 MG-UNIT TABS Take 1 tablet by mouth daily.    [provider]  Cholecalciferol  (VITAMIN D ) 2000 UNITS tablet Take 2,000 Units by mouth daily.    [provider]  denosumab (PROLIA) 60 MG/ML SOSY injection Inject 60 mg into the skin every 6 (six) months.    [provider]  fexofenadine (ALLEGRA) 180 MG tablet Take 1 tablet by mouth daily.    [provider]  fluocinonide (LIDEX) 0.05 % external solution APPLY 1 ML TOPICALLY TO SKIN (SCALP) ONCE DAILY NIGHTLY 11/30/20   [provider]  fluticasone  (FLONASE ) 50 MCG/ACT nasal spray Place 2 sprays into both nostrils daily.    [provider]  GLUCOSAMINE SULFATE PO Take 2 tablets by mouth daily. InvigoFlex GS Glucosamine Sulfate    [provider]  HYDROcodone -acetaminophen  (NORCO/VICODIN) 5-325 MG tablet Take 1 tablet by mouth every 6 (six) hours as needed (back pain.).     [provider]  ketoconazole (NIZORAL) 2 % shampoo Apply 1 application topically  2 (two) times a week.    [provider]  metroNIDAZOLE (METROCREAM) 0.75 % cream Apply 1 application topically 2 (two) times daily. Rosacea    [provider]  Multiple Vitamin (MULTIVITAMIN WITH MINERALS) TABS tablet Take 1 tablet by mouth daily. Centrum Silver    [provider]  Probiotic Product (PROBIOTIC PO) Take 1 capsule by mouth daily. Ultimate Flora    [provider]  psyllium (METAMUCIL) 0.52 g capsule Take 3.12 g by mouth daily.    [provider]  rosuvastatin  (CRESTOR ) 10 MG tablet Take 1 tablet (10 mg total) by mouth at bedtime. 08/22/21   Ladona Heinz, MD    Family History Family History  Problem Relation Age of Onset   Osteoporosis Mother    Stroke  Mother 56   Heart failure Father    Heart attack Father        MI at age 44   Congestive Heart Failure Father 55       passed away   Osteoporosis Sister    Heart disease Sister    Osteoporosis Sister    Atrial fibrillation Sister    Skin cancer Brother        melanoma   Stroke Maternal Grandfather    Stroke Paternal Grandmother    Bone cancer Paternal Grandfather    Breast cancer Neg Hx     Social History Social History   Tobacco Use   Smoking status: Never   Smokeless tobacco: Never  Vaping Use   Vaping status: Never Used  Substance Use Topics   Alcohol use: Yes    Comment: 1-2 a week   Drug use: Never     Allergies   Ciprofloxacin, Tetracyclines & related, Diovan [valsartan], Ace inhibitors, Angiotensin receptor blockers, Lotrel [amlodipine  besy-benazepril hcl], and Sulfa antibiotics   Review of Systems Review of Systems Per HPI  Physical Exam Triage Vital Signs ED Triage Vitals  Encounter Vitals Group     BP 06/15/24 1418 127/83     Girls Systolic BP Percentile --      Girls Diastolic BP Percentile --      Boys Systolic BP Percentile --      Boys Diastolic BP Percentile --      Pulse Rate 06/15/24 1418 67     Resp 06/15/24 1418 18     Temp 06/15/24 1418 98.7 F (37.1 C)     Temp Source 06/15/24 1418 Oral     SpO2 06/15/24 1418 97 %     Weight --      Height --      Head Circumference --      Peak Flow --      Pain Score 06/15/24 1417 2     Pain Loc --      Pain Education --      Exclude from Growth Chart --    No data found.  Updated Vital Signs BP 127/83 (BP Location: Right Arm)   Pulse 67   Temp 98.7 F (37.1 C) (Oral)   Resp 18   LMP  (LMP Unknown)   SpO2 97%   Visual Acuity Right Eye Distance:   Left Eye Distance:   Bilateral Distance:    Right Eye Near:   Left Eye Near:    Bilateral Near:     Physical Exam Vitals and nursing note reviewed.  Constitutional:      General: She is not in acute distress.    Appearance: Normal  appearance.  Eyes:  Extraocular Movements: Extraocular movements intact.     Pupils: Pupils are equal, round, and reactive to light.  Cardiovascular:     Rate and Rhythm: Normal rate and regular rhythm.     Pulses: Normal pulses.     Heart sounds: Normal heart sounds.  Pulmonary:     Effort: Pulmonary effort is normal. No respiratory distress.     Breath sounds: Normal breath sounds. No stridor. No wheezing, rhonchi or rales.  Abdominal:     General: Bowel sounds are normal.     Palpations: Abdomen is soft.     Tenderness: There is no abdominal tenderness. There is no right CVA tenderness or left CVA tenderness.  Musculoskeletal:     Cervical back: Normal range of motion.  Skin:    General: Skin is warm and dry.  Neurological:     General: No focal deficit present.     Mental Status: She is alert and oriented to person, place, and time.  Psychiatric:        Mood and Affect: Mood normal.        Behavior: Behavior normal.      UC Treatments / Results  Labs (all labs ordered are listed, but only abnormal results are displayed) Labs Reviewed  POCT URINALYSIS DIP (MANUAL ENTRY) - Abnormal; Notable for the following components:      Result Value   Clarity, UA cloudy (*)    Blood, UA large (*)    Protein Ur, POC =30 (*)    Leukocytes, UA Large (3+) (*)    All other components within normal limits  URINE CULTURE    EKG   Radiology No results found.  Procedures Procedures (including critical care time)  Medications Ordered in UC Medications - No data to display  Initial Impression / Assessment and Plan / UC Course  I have reviewed the triage vital signs and the nursing notes.  Pertinent labs & imaging results that were available during my care of the patient were reviewed by me and considered in my medical decision making (see chart for details).  Urinalysis with positive leukocytes, protein, and blood, suspicious for UTI.  Will treat empirically with Keflex  500  mg 4 times daily for the next 7 days while urine culture is pending.  Supportive care recommendations were provided and discussed with the patient to include fluids, rest, over-the-counter analgesics, avoiding caffeine, and urinating approximately every 2 hours.  Patient was advised if the result of the culture is negative, she will be contacted and advised to stop the antibiotic.  Patient was in agreement with this plan of care and verbalizes understanding.  All questions were answered.  Patient stable for discharge.  Final Clinical Impressions(s) / UC Diagnoses   Final diagnoses:  UTI symptoms   Discharge Instructions   None    ED Prescriptions   None    PDMP not reviewed this encounter.   Gilmer Etta PARAS, NP 06/15/24 1449

## 2024-06-15 NOTE — ED Triage Notes (Signed)
 Pt reports dysuria since this am. Pt reports I've felt some sort of irritation for last several weeks but couldn't pinpoint what is was until this morning. Denies any abd pain, nausea, chills, fever, back pain.

## 2024-06-16 LAB — URINE CULTURE

## 2024-06-19 ENCOUNTER — Ambulatory Visit (HOSPITAL_COMMUNITY): Payer: Self-pay

## 2024-06-19 MED ORDER — FLUCONAZOLE 150 MG PO TABS: 150.0000 mg | ORAL_TABLET | Freq: Once | ORAL | 0 refills | Status: AC

## 2024-06-22 ENCOUNTER — Other Ambulatory Visit: Payer: Self-pay | Admitting: Family Medicine

## 2024-06-22 DIAGNOSIS — Z1231 Encounter for screening mammogram for malignant neoplasm of breast: Secondary | ICD-10-CM

## 2024-07-09 ENCOUNTER — Ambulatory Visit (HOSPITAL_BASED_OUTPATIENT_CLINIC_OR_DEPARTMENT_OTHER)
Admission: RE | Admit: 2024-07-09 | Discharge: 2024-07-09 | Disposition: A | Discharge status: Home / Self Care | Payer: Self-pay | Source: Ambulatory Visit | Attending: Cardiology | Admitting: Cardiology

## 2024-07-09 ENCOUNTER — Other Ambulatory Visit (HOSPITAL_BASED_OUTPATIENT_CLINIC_OR_DEPARTMENT_OTHER)

## 2024-07-09 DIAGNOSIS — I10 Essential (primary) hypertension: Secondary | ICD-10-CM | POA: Insufficient documentation

## 2024-07-10 ENCOUNTER — Ambulatory Visit: Payer: Self-pay | Admitting: Cardiology

## 2024-07-10 NOTE — Progress Notes (Signed)
 Coronary calcium  score is only mildly elevated. Patient asymptomatic and on appropriate statins. September 2024 shows total cholesterol at 163, triglycerides at 83, HDL at 83, and LDL at 65. These values indicate good control of lipid levels.  No further evaluation or change in therapy needed.

## 2024-07-23 ENCOUNTER — Ambulatory Visit
Admission: RE | Admit: 2024-07-23 | Discharge: 2024-07-23 | Disposition: A | Discharge status: Home / Self Care | Source: Ambulatory Visit | Attending: Family Medicine | Admitting: Family Medicine

## 2024-07-23 DIAGNOSIS — Z1231 Encounter for screening mammogram for malignant neoplasm of breast: Secondary | ICD-10-CM
